# Patient Record
Sex: Male | Born: 1965 | Race: White | Hispanic: No | Marital: Married | Smoking: Never smoker
Health system: Southern US, Community
[De-identification: ages and names within clinical notes are randomized; demographics above are authoritative.]

## PROBLEM LIST (undated history)

## (undated) DIAGNOSIS — K219 Gastro-esophageal reflux disease without esophagitis: Secondary | ICD-10-CM

## (undated) DIAGNOSIS — C801 Malignant (primary) neoplasm, unspecified: Secondary | ICD-10-CM

## (undated) DIAGNOSIS — I10 Essential (primary) hypertension: Secondary | ICD-10-CM

## (undated) DIAGNOSIS — K5792 Diverticulitis of intestine, part unspecified, without perforation or abscess without bleeding: Secondary | ICD-10-CM

## (undated) DIAGNOSIS — G4730 Sleep apnea, unspecified: Secondary | ICD-10-CM

## (undated) HISTORY — DX: Essential (primary) hypertension: I10

## (undated) HISTORY — DX: Gastro-esophageal reflux disease without esophagitis: K21.9

## (undated) HISTORY — DX: Diverticulitis of intestine, part unspecified, without perforation or abscess without bleeding: K57.92

## (undated) HISTORY — DX: Malignant (primary) neoplasm, unspecified: C80.1

## (undated) HISTORY — DX: Sleep apnea, unspecified: G47.30

---

## 1998-03-31 DIAGNOSIS — G4730 Sleep apnea, unspecified: Secondary | ICD-10-CM

## 1998-03-31 HISTORY — DX: Sleep apnea, unspecified: G47.30

## 2004-05-22 ENCOUNTER — Ambulatory Visit: Payer: Self-pay

## 2007-03-01 ENCOUNTER — Emergency Department: Payer: Self-pay | Admitting: Emergency Medicine

## 2007-03-01 ENCOUNTER — Other Ambulatory Visit: Payer: Self-pay

## 2007-03-02 ENCOUNTER — Other Ambulatory Visit: Payer: Self-pay

## 2009-08-24 ENCOUNTER — Ambulatory Visit: Payer: Self-pay | Admitting: Anesthesiology

## 2010-06-04 ENCOUNTER — Ambulatory Visit: Payer: Self-pay | Admitting: Anesthesiology

## 2010-06-21 ENCOUNTER — Ambulatory Visit: Payer: Self-pay | Admitting: Anesthesiology

## 2010-07-22 ENCOUNTER — Ambulatory Visit: Payer: Self-pay | Admitting: Anesthesiology

## 2010-09-03 ENCOUNTER — Ambulatory Visit: Payer: Self-pay | Admitting: Anesthesiology

## 2010-11-22 ENCOUNTER — Ambulatory Visit: Payer: Self-pay | Admitting: Anesthesiology

## 2011-10-01 ENCOUNTER — Ambulatory Visit: Payer: Self-pay | Admitting: Unknown Physician Specialty

## 2012-02-11 ENCOUNTER — Ambulatory Visit: Payer: Self-pay | Admitting: Family Medicine

## 2012-03-04 ENCOUNTER — Ambulatory Visit: Payer: Self-pay | Admitting: Urology

## 2012-03-31 DIAGNOSIS — K5792 Diverticulitis of intestine, part unspecified, without perforation or abscess without bleeding: Secondary | ICD-10-CM

## 2012-03-31 HISTORY — DX: Diverticulitis of intestine, part unspecified, without perforation or abscess without bleeding: K57.92

## 2012-05-01 DIAGNOSIS — C801 Malignant (primary) neoplasm, unspecified: Secondary | ICD-10-CM

## 2012-05-01 HISTORY — PX: KIDNEY SURGERY: SHX687

## 2012-05-01 HISTORY — DX: Malignant (primary) neoplasm, unspecified: C80.1

## 2014-09-06 ENCOUNTER — Encounter: Payer: Self-pay | Admitting: Family Medicine

## 2014-09-06 ENCOUNTER — Ambulatory Visit (INDEPENDENT_AMBULATORY_CARE_PROVIDER_SITE_OTHER): Payer: Federal, State, Local not specified - PPO | Admitting: Family Medicine

## 2014-09-06 VITALS — BP 132/92 | HR 68 | Temp 98.1°F | Resp 16 | Ht 71.0 in | Wt 230.4 lb

## 2014-09-06 DIAGNOSIS — I10 Essential (primary) hypertension: Secondary | ICD-10-CM | POA: Diagnosis not present

## 2014-09-06 DIAGNOSIS — K5730 Diverticulosis of large intestine without perforation or abscess without bleeding: Secondary | ICD-10-CM | POA: Diagnosis not present

## 2014-09-06 MED ORDER — CHLORTHALIDONE 25 MG PO TABS: 25.0000 mg | ORAL_TABLET | Freq: Every day | ORAL | Status: DC

## 2014-09-06 NOTE — Progress Notes (Signed)
Subjective:     Patient ID: Samuel Oliver, male   DOB: 10-30-1965, 49 y.o.   MRN: [id number]  HPI  Chief Complaint  Patient presents with  . Hypertension    Patient comes in office today with concerns of elevated blood pressure. Patient states at his doctors appt Monday reading was 180/120 and last night was 145/98.  Reports compliance with HCTZ. States he is ready to schedule screening colonoscopy.   Review of Systems  Respiratory: Negative for shortness of breath.   Cardiovascular: Negative for chest pain and palpitations.       Objective:   Physical Exam  Constitutional: He appears well-developed and well-nourished. No distress.  Cardiovascular: Normal rate and regular rhythm.   Pulmonary/Chest: Breath sounds normal.  Musculoskeletal: He exhibits no edema (in his lower extremities).       Assessment:     1. Essential hypertension  - chlorthalidone (HYGROTON) 25 MG tablet; Take 1 tablet (25 mg total) by mouth daily.  Dispense: 30 tablet; Refill: 0  2. Diverticulosis of large intestine without hemorrhage  - Ambulatory referral to General Surgery. Wishes to have Dr. Bary Castilla perform his procedure.    Plan:     Will stop HCTZ

## 2014-09-06 NOTE — Patient Instructions (Signed)
Stop HCTZ and start new medication tomorrow.

## 2014-09-08 ENCOUNTER — Encounter: Payer: Self-pay | Admitting: General Surgery

## 2014-09-14 ENCOUNTER — Telehealth: Payer: Self-pay | Admitting: Family Medicine

## 2014-09-14 NOTE — Telephone Encounter (Signed)
Give it two weeks (6 more days) then come in for a nurse bp check.

## 2014-09-14 NOTE — Telephone Encounter (Signed)
Pt states that he was given prescription for a new blood pressure medication but his blood pressure is still erratic.Please advise

## 2014-09-15 NOTE — Telephone Encounter (Signed)
Pt was advised of below and triaged to [facility]. Thanks TNP

## 2014-09-15 NOTE — Telephone Encounter (Signed)
Advised patient that he should wait a few more days and give the BP med time to get in his system. Patient denies any SOB, swelling in the extremities, dizziness or chest pain. He reports that he does still have occasional headaches but thinks it could be allergy related. Patient reports that his dystolic number is in the normal range (<68mmHg) in the mornings, but as the day goes on his dyastolic number ranges in the mid to upper 90s. Instructed patient to continue to monitor BP and come in for BP check next week. Patient verbalizes understanding and agrees to treatment plan.

## 2014-09-18 NOTE — Telephone Encounter (Signed)
Pt states his blood pressure is still running high and he states his heart rate is high.  Pt states he can tell his BP is high.  Pt is asking does he need to change the medication?  CB#[phone]/MJ

## 2014-09-19 ENCOUNTER — Other Ambulatory Visit: Payer: Self-pay | Admitting: Family Medicine

## 2014-09-19 ENCOUNTER — Ambulatory Visit: Payer: Federal, State, Local not specified - PPO | Admitting: Family Medicine

## 2014-09-19 ENCOUNTER — Encounter: Payer: Self-pay | Admitting: Family Medicine

## 2014-09-19 VITALS — BP 118/88 | HR 96 | Resp 16

## 2014-09-19 DIAGNOSIS — I10 Essential (primary) hypertension: Secondary | ICD-10-CM

## 2014-09-19 MED ORDER — CHLORTHALIDONE 25 MG PO TABS: 25.0000 mg | ORAL_TABLET | Freq: Every day | ORAL | Status: DC

## 2014-09-19 NOTE — Telephone Encounter (Signed)
Appt arranged for this afternoon.

## 2014-09-19 NOTE — Telephone Encounter (Signed)
CMA bp check today. I will add second medication if elevated at that time.

## 2014-09-19 NOTE — Telephone Encounter (Signed)
Would you suggest office visit at this point to discuss options for treatment?

## 2014-09-19 NOTE — Progress Notes (Signed)
Patient ID: Samuel Oliver, male   DOB: 11/04/1965, 49 y.o.   MRN: [id number] Remain on chlorthalidone at present dose. Return in 2-4 weeks for nurse bp check.

## 2014-09-20 ENCOUNTER — Ambulatory Visit (INDEPENDENT_AMBULATORY_CARE_PROVIDER_SITE_OTHER): Payer: Federal, State, Local not specified - PPO | Admitting: General Surgery

## 2014-09-20 ENCOUNTER — Encounter: Payer: Self-pay | Admitting: General Surgery

## 2014-09-20 VITALS — BP 128/70 | HR 82 | Resp 14 | Ht 71.0 in | Wt 228.0 lb

## 2014-09-20 DIAGNOSIS — Z1211 Encounter for screening for malignant neoplasm of colon: Secondary | ICD-10-CM | POA: Diagnosis not present

## 2014-09-20 MED ORDER — POLYETHYLENE GLYCOL 3350 17 GM/SCOOP PO POWD: ORAL | Status: DC

## 2014-09-20 NOTE — Patient Instructions (Addendum)
Colonoscopy A colonoscopy is an exam to look at the entire large intestine (colon). This exam can help find problems such as tumors, polyps, inflammation, and areas of bleeding. The exam takes about 1 hour.  LET [facility] CARE PROVIDER KNOW ABOUT:   Any allergies you have.  All medicines you are taking, including vitamins, herbs, eye drops, creams, and over-the-counter medicines.  Previous problems you or members of your family have had with the use of anesthetics.  Any blood disorders you have.  Previous surgeries you have had.  Medical conditions you have. RISKS AND COMPLICATIONS  Generally, this is a safe procedure. However, as with any procedure, complications can occur. Possible complications include:  Bleeding.  Tearing or rupture of the colon wall.  Reaction to medicines given during the exam.  Infection (rare). BEFORE THE PROCEDURE   Ask your health care provider about changing or stopping your regular medicines.  You may be prescribed an oral bowel prep. This involves drinking a large amount of medicated liquid, starting the day before your procedure. The liquid will cause you to have multiple loose stools until your stool is almost clear or light green. This cleans out your colon in preparation for the procedure.  Do not eat or drink anything else once you have started the bowel prep, unless your health care provider tells you it is safe to do so.  Arrange for someone to drive you home after the procedure. PROCEDURE   You will be given medicine to help you relax (sedative).  You will lie on your side with your knees bent.  A long, flexible tube with a light and camera on the end (colonoscope) will be inserted through the rectum and into the colon. The camera sends video back to a computer screen as it moves through the colon. The colonoscope also releases carbon dioxide gas to inflate the colon. This helps your health care provider see the area better.  During  the exam, your health care provider may take a small tissue sample (biopsy) to be examined under a microscope if any abnormalities are found.  The exam is finished when the entire colon has been viewed. AFTER THE PROCEDURE   Do not drive for 24 hours after the exam.  You may have a small amount of blood in your stool.  You may pass moderate amounts of gas and have mild abdominal cramping or bloating. This is caused by the gas used to inflate your colon during the exam.  Ask when your test results will be ready and how you will get your results. Make sure you get your test results. Document Released: 03/14/2000 Document Revised: 01/05/2013 Document Reviewed: 11/22/2012 [facility] Patient Information 2015 [place], [place]. This information is not intended to replace advice given to you by your health care provider. Make sure you discuss any questions you have with your health care provider.  Patient has been scheduled for a colonoscopy on 11-08-14 at [facility].

## 2014-09-20 NOTE — Progress Notes (Signed)
Patient ID: Samuel Oliver, male   DOB: 20-Dec-1965, 49 y.o.   MRN: [id number]  Chief Complaint  Patient presents with  . Colonoscopy    HPI Samuel Oliver is a 49 y.o. male here for assessment for a colonoscopy. He has had none prior. Denies any gastrointestinal issues. Bowels are regular and daily, no bleeding He has had kidney cancer in the past, detected while doing a CT scan for diverticulitis. No family history of colon cancer.   HPI  Past Medical History  Diagnosis Date  . GERD (gastroesophageal reflux disease)   . Hypertension   . Diverticulitis 2014  . Cancer Feb 2014    left kidney  . Sleep apnea 2000    CPAP    Past Surgical History  Procedure Laterality Date  . Kidney surgery Left 05/2012    [facility] partial removal     Family History  Problem Relation Age of Onset  . Heart disease Father     Social History History  Substance Use Topics  . Smoking status: Never Smoker   . Smokeless tobacco: Never Used  . Alcohol Use: 0.0 oz/week    0 Standard drinks or equivalent per week    Allergies  Allergen Reactions  . Beef Extract Anaphylaxis  . 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 Pork (Porcine) Protein Anaphylaxis    Current Outpatient Prescriptions  Medication Sig Dispense Refill  . chlorthalidone (HYGROTON) 25 MG tablet Take 1 tablet (25 mg total) by mouth daily. 30 tablet 0  . omeprazole (PRILOSEC) 20 MG capsule 1 TABLET DR, ORAL, DAILY  3  . polyethylene glycol powder (GLYCOLAX/MIRALAX) powder 255 grams one bottle for colonoscopy prep 255 g 0   No current facility-administered medications for this visit.    Review of Systems Review of Systems  Constitutional: Negative.   Respiratory: Negative.   Cardiovascular: Negative.   Gastrointestinal: Negative for nausea, diarrhea, constipation, blood in stool, anal bleeding and rectal pain.    Blood pressure 128/70, pulse 82, resp. rate 14, height 5\' 11"  (1.803 m), weight 228 lb (103.42  kg).  Physical Exam Physical Exam  Constitutional: He is oriented to person, place, and time. He appears well-developed and well-nourished.  HENT:  Mouth/Throat: Oropharynx is clear and moist.  Eyes: Conjunctivae are normal. No scleral icterus.  Neck: Neck supple.  Cardiovascular: Normal rate, regular rhythm and normal heart sounds.   Pulmonary/Chest: Effort normal and breath sounds normal.  Lymphadenopathy:    He has no cervical adenopathy.  Neurological: He is alert and oriented to person, place, and time.  Skin: Skin is warm and dry.  Psychiatric: He has a normal mood and affect.    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 Negative margins. This was resected in early 2014. Creatinine in June 2015 was 1.02. Unchanged from prior exams.  Assessment    Candidate for screening colonoscopy.    Plan    Colonoscopy with possible biopsy/polypectomy prn: Information regarding the procedure, including its potential risks and complications (including but not limited to perforation of the bowel, which may require emergency surgery to repair, and bleeding) was verbally given to the patient. Educational information regarding lower instestinal endoscopy was given to the patient. Written instructions for how to complete the bowel prep using Miralax were provided. The importance of drinking ample fluids to avoid dehydration as a result of the prep emphasized.    Patient has been scheduled  for a colonoscopy on 11-08-14 at [facility].   PCP/Ref:  Theresia Lo 09/21/2014, 7:46 PM

## 2014-09-21 DIAGNOSIS — Z1211 Encounter for screening for malignant neoplasm of colon: Secondary | ICD-10-CM | POA: Insufficient documentation

## 2014-09-21 NOTE — H&P (Signed)
HPI  Samuel Oliver is a 49 y.o. male here for assessment for a colonoscopy. He has had none prior. Denies any gastrointestinal issues. Bowels are regular and daily, no bleeding  He has had kidney cancer in the past, detected while doing a CT scan for diverticulitis. No family history of colon cancer.  HPI  Past Medical History   Diagnosis  Date   .  GERD (gastroesophageal reflux disease)    .  Hypertension    .  Diverticulitis  2014   .  Cancer  Feb 2014     left kidney   .  Sleep apnea  2000     CPAP    Past Surgical History   Procedure  Laterality  Date   .  Kidney surgery  Left  05/2012     [facility] partial removal    Family History   Problem  Relation  Age of Onset   .  Heart disease  Father     Social History  History   Substance Use Topics   .  Smoking status:  Never Smoker   .  Smokeless tobacco:  Never Used   .  Alcohol Use:  0.0 oz/week     0 Standard drinks or equivalent per week    Allergies   Allergen  Reactions   .  Beef Extract  Anaphylaxis   .  Milk-Related Compounds  Anaphylaxis     from mammal   .  Pork (Porcine) Protein  Anaphylaxis    Current Outpatient Prescriptions   Medication  Sig  Dispense  Refill   .  chlorthalidone (HYGROTON) 25 MG tablet  Take 1 tablet (25 mg total) by mouth daily.  30 tablet  0   .  omeprazole (PRILOSEC) 20 MG capsule  1 TABLET DR, ORAL, DAILY   3   .  polyethylene glycol powder (GLYCOLAX/MIRALAX) powder  255 grams one bottle for colonoscopy prep  255 g  0    No current facility-administered medications for this visit.    Review of Systems  Review of Systems  Constitutional: Negative.  Respiratory: Negative.  Cardiovascular: Negative.  Gastrointestinal: Negative for nausea, diarrhea, constipation, blood in stool, anal bleeding and rectal pain.   Blood pressure 128/70, pulse 82, resp. rate 14, height 5\' 11"  (1.803 m), weight 228 lb (103.42 kg).  Physical Exam  Physical Exam  Constitutional: He is oriented to person, place,  and time. He appears well-developed and well-nourished.  HENT:  Mouth/Throat: Oropharynx is clear and moist.  Eyes: Conjunctivae are normal. No scleral icterus.  Neck: Neck supple.  Cardiovascular: Normal rate, regular rhythm and normal heart sounds.  Pulmonary/Chest: Effort normal and breath sounds normal.  Lymphadenopathy:  He has no cervical adenopathy.  Neurological: He is alert and oriented to person, place, and time.  Skin: Skin is warm and dry.  Psychiatric: He has a normal mood and affect.   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 Negative margins. This was resected in early 2014. Creatinine in June 2015 was 1.02. Unchanged from prior exams.  Assessment   Candidate for screening colonoscopy.   Plan   Colonoscopy with possible biopsy/polypectomy prn: Information regarding the procedure, including its potential risks and complications (including but not limited to perforation of the bowel, which may require emergency surgery to repair, and bleeding) was verbally given to the patient. Educational information regarding lower instestinal endoscopy was given to the patient.  Written instructions for how to complete the bowel prep using Miralax were provided. The importance of drinking ample fluids to avoid dehydration as a result of the prep emphasized.   Patient has been scheduled for a colonoscopy on 11-08-14 at [facility].  PCP/Ref: Theresia Lo

## 2014-10-09 ENCOUNTER — Ambulatory Visit
Admission: RE | Admit: 2014-10-09 | Discharge: 2014-10-09 | Disposition: A | Discharge status: Home / Self Care | Payer: Federal, State, Local not specified - PPO | Source: Ambulatory Visit | Attending: Family Medicine | Admitting: Family Medicine

## 2014-10-09 ENCOUNTER — Other Ambulatory Visit: Payer: Self-pay | Admitting: Family Medicine

## 2014-10-09 ENCOUNTER — Ambulatory Visit (INDEPENDENT_AMBULATORY_CARE_PROVIDER_SITE_OTHER): Payer: Federal, State, Local not specified - PPO | Admitting: Family Medicine

## 2014-10-09 ENCOUNTER — Encounter: Payer: Self-pay | Admitting: Family Medicine

## 2014-10-09 VITALS — BP 108/86 | HR 78 | Temp 98.1°F | Resp 16 | Wt 228.0 lb

## 2014-10-09 DIAGNOSIS — Z13220 Encounter for screening for lipoid disorders: Secondary | ICD-10-CM | POA: Diagnosis not present

## 2014-10-09 DIAGNOSIS — M25512 Pain in left shoulder: Secondary | ICD-10-CM | POA: Diagnosis present

## 2014-10-09 DIAGNOSIS — M19012 Primary osteoarthritis, left shoulder: Secondary | ICD-10-CM | POA: Insufficient documentation

## 2014-10-09 DIAGNOSIS — I10 Essential (primary) hypertension: Secondary | ICD-10-CM | POA: Diagnosis not present

## 2014-10-09 MED ORDER — CHLORTHALIDONE 25 MG PO TABS: 25.0000 mg | ORAL_TABLET | Freq: Every day | ORAL | Status: DC

## 2014-10-09 NOTE — Progress Notes (Signed)
Subjective:     Patient ID: Samuel Oliver, male   DOB: 1966/01/04, 49 y.o.   MRN: [id number]  HPI  Chief Complaint  Patient presents with  . Blood Pressure Check    patient is present in office for follow up on 09/06/14. Patient was started on Chlorthalidone 25mg  qd and discontinued HCTZ 25mg , blood pressure in office that day was132/92  . Shoulder Injury    patient states that he injured his left shoulder a year ago and is still having pain in his left shoulder/arm. Patient states that when he bends or flexes his arm a certain way he still has pain.   States he noticed the pain in his anterior shoulder when he rotates his arm out.   Review of Systems  Musculoskeletal:       States he resumed weight lifting in January and his shoulder recommenced hurting.       Objective:   Physical Exam  Constitutional: He appears well-developed and well-nourished.  Cardiovascular: Normal rate and regular rhythm.   Pulmonary/Chest: Breath sounds normal.  Musculoskeletal:  Grips and left shoulder strength 5/5. Mild discomfort flexing > 90 degrees. Increased pain left anterior  when externally rotating his left shoulder > 90 degrees       Assessment:    1. Essential hypertension - chlorthalidone (HYGROTON) 25 MG tablet; Take 1 tablet (25 mg total) by mouth daily.  Dispense: 90 tablet; Refill: 3 - Comprehensive metabolic panel; Future  2. Left anterior shoulder pain - DG Shoulder Left; Future  3. Screening cholesterol level - Lipid panel; Future    Plan:    Further f/u pending x-ray and lab work. Consider orthopedic referral.

## 2014-10-09 NOTE — Patient Instructions (Signed)
I will call you with your x-ray report in the next 24 hours and your labs in the future.

## 2014-11-01 ENCOUNTER — Telehealth: Payer: Self-pay | Admitting: *Deleted

## 2014-11-01 NOTE — Telephone Encounter (Signed)
Patient contacted today and states his blood pressure pill has been changed to amlodipine besylate 5 mg once daily. He has also been placed on Meloxicam 15 mg once daily. Patient's medication list has been updated accordingly.  He reports that he has Miralax prescription.  We will proceed with colonoscopy that is scheduled at [facility] for 11-08-14.   Patient instructed to call the office should he have further questions.

## 2014-11-08 ENCOUNTER — Ambulatory Visit
Admission: RE | Admit: 2014-11-08 | Discharge: 2014-11-08 | Disposition: A | Discharge status: Home / Self Care | Payer: Federal, State, Local not specified - PPO | Source: Ambulatory Visit | Attending: General Surgery | Admitting: General Surgery

## 2014-11-08 ENCOUNTER — Encounter
Admission: RE | Disposition: A | Discharge status: Home / Self Care | Payer: Self-pay | Source: Ambulatory Visit | Attending: General Surgery

## 2014-11-08 ENCOUNTER — Ambulatory Visit: Payer: Federal, State, Local not specified - PPO | Admitting: Anesthesiology

## 2014-11-08 DIAGNOSIS — Z791 Long term (current) use of non-steroidal anti-inflammatories (NSAID): Secondary | ICD-10-CM | POA: Insufficient documentation

## 2014-11-08 DIAGNOSIS — Z79899 Other long term (current) drug therapy: Secondary | ICD-10-CM | POA: Insufficient documentation

## 2014-11-08 DIAGNOSIS — Z85528 Personal history of other malignant neoplasm of kidney: Secondary | ICD-10-CM | POA: Insufficient documentation

## 2014-11-08 DIAGNOSIS — K219 Gastro-esophageal reflux disease without esophagitis: Secondary | ICD-10-CM | POA: Insufficient documentation

## 2014-11-08 DIAGNOSIS — Z8249 Family history of ischemic heart disease and other diseases of the circulatory system: Secondary | ICD-10-CM | POA: Insufficient documentation

## 2014-11-08 DIAGNOSIS — Z905 Acquired absence of kidney: Secondary | ICD-10-CM | POA: Insufficient documentation

## 2014-11-08 DIAGNOSIS — K5730 Diverticulosis of large intestine without perforation or abscess without bleeding: Secondary | ICD-10-CM | POA: Diagnosis not present

## 2014-11-08 DIAGNOSIS — G4730 Sleep apnea, unspecified: Secondary | ICD-10-CM | POA: Diagnosis not present

## 2014-11-08 DIAGNOSIS — Z91011 Allergy to milk products: Secondary | ICD-10-CM | POA: Insufficient documentation

## 2014-11-08 DIAGNOSIS — Z1211 Encounter for screening for malignant neoplasm of colon: Secondary | ICD-10-CM

## 2014-11-08 DIAGNOSIS — Z91018 Allergy to other foods: Secondary | ICD-10-CM | POA: Diagnosis not present

## 2014-11-08 DIAGNOSIS — I10 Essential (primary) hypertension: Secondary | ICD-10-CM | POA: Insufficient documentation

## 2014-11-08 HISTORY — PX: COLONOSCOPY: SHX5424

## 2014-11-08 SURGERY — COLONOSCOPY
Anesthesia: General

## 2014-11-08 MED ORDER — FENTANYL CITRATE (PF) 100 MCG/2ML IJ SOLN
INTRAMUSCULAR | Status: DC | PRN
  Administered 2014-11-08: 50 ug via INTRAVENOUS

## 2014-11-08 MED ORDER — SODIUM CHLORIDE 0.9 % IV SOLN
INTRAVENOUS | Status: DC
  Administered 2014-11-08 (×2): via INTRAVENOUS

## 2014-11-08 MED ORDER — PROPOFOL 10 MG/ML IV BOLUS
INTRAVENOUS | Status: DC | PRN
  Administered 2014-11-08: 50 mg via INTRAVENOUS

## 2014-11-08 MED ORDER — LIDOCAINE HCL (PF) 2 % IJ SOLN
INTRAMUSCULAR | Status: DC | PRN
  Administered 2014-11-08: 50 mg

## 2014-11-08 MED ORDER — MIDAZOLAM HCL 5 MG/5ML IJ SOLN
INTRAMUSCULAR | Status: DC | PRN
  Administered 2014-11-08: 1 mg via INTRAVENOUS

## 2014-11-08 MED ORDER — PROPOFOL INFUSION 10 MG/ML OPTIME
INTRAVENOUS | Status: DC | PRN
  Administered 2014-11-08: 120 ug/kg/min via INTRAVENOUS

## 2014-11-08 NOTE — Anesthesia Preprocedure Evaluation (Signed)
Anesthesia Evaluation  Patient identified by MRN, date of birth, ID band Patient awake    Reviewed: Allergy & Precautions, NPO status , Patient's Chart, lab work & pertinent test results  History of Anesthesia Complications (+) PONV  Airway Mallampati: III  TM Distance: >3 FB Neck ROM: Full    Dental  (+) Chipped   Pulmonary sleep apnea and Continuous Positive Airway Pressure Ventilation ,          Cardiovascular hypertension, Pt. on medications     Neuro/Psych    GI/Hepatic GERD-  Medicated and Controlled,  Endo/Other    Renal/GU Renal disease (Partial nephrectomy)     Musculoskeletal   Abdominal   Peds  Hematology   Anesthesia Other Findings   Reproductive/Obstetrics                             Anesthesia Physical Anesthesia Plan  ASA: II  Anesthesia Plan: General   Post-op Pain Management:    Induction: Intravenous  Airway Management Planned: Nasal Cannula  Additional Equipment:   Intra-op Plan:   Post-operative Plan:   Informed Consent: I have reviewed the patients History and Physical, chart, labs and discussed the procedure including the risks, benefits and alternatives for the proposed anesthesia with the patient or authorized representative who has indicated his/her understanding and acceptance.     Plan Discussed with:   Anesthesia Plan Comments:         Anesthesia Quick Evaluation

## 2014-11-08 NOTE — Op Note (Signed)
[facility] Patient Name: Samuel Oliver Procedure Date: 11/08/2014 10:48 AM MRN: [id number] Account #: [id number] Date of Birth: 1965/10/29 Admit Type: Outpatient Age: 49 Room: [facility] ENDO ROOM 1 Gender: Male Note Status: Finalized Procedure:         Colonoscopy Indications:       Screening for colorectal malignant neoplasm Providers:         Robert Bellow, MD Referring MD:      Janine Ores. Rosanna Randy, MD (Referring MD) Medicines:         Monitored Anesthesia Care Complications:     No immediate complications. Procedure:         Pre-Anesthesia Assessment:                    - Prior to the procedure, a History and Physical was                     performed, and patient medications, allergies and                     sensitivities were reviewed. The patient's tolerance of                     previous anesthesia was reviewed.                    - The risks and benefits of the procedure and the sedation                     options and risks were discussed with the patient. All                     questions were answered and informed consent was obtained.                    After obtaining informed consent, the colonoscope was                     passed under direct vision. Throughout the procedure, the                     patient's blood pressure, pulse, and oxygen saturations                     were monitored continuously. The Colonoscope was                     introduced through the anus and advanced to the the cecum,                     identified by the appendiceal orifice, ileocecal valve and                     palpation. The colonoscopy was performed without                     difficulty. The patient tolerated the procedure well. The                     quality of the bowel preparation was excellent. Findings:      A few medium-mouthed diverticula were found in the sigmoid colon.      The retroflexed view of the distal rectum and anal verge was  normal and       showed no anal  or rectal abnormalities. Impression:        - Diverticulosis in the sigmoid colon.                    - The distal rectum and anal verge are normal on                     retroflexion view.                    - No specimens collected. Recommendation:    - Repeat colonoscopy in 10 years for screening purposes. Diagnosis Code(s): --- Professional ---                    K57.30, Diverticulosis of large intestine without                     perforation or abscess without bleeding                    Z12.11, Encounter for screening for malignant neoplasm of                     colon Robert Bellow, MD 11/08/2014 11:10:47 AM This report has been signed electronically. Number of Addenda: 0 Note Initiated On: 11/08/2014 10:48 AM Scope Withdrawal Time: 0 hours 8 minutes 57 seconds  Total Procedure Duration: 0 hours 15 minutes 44 seconds       [facility]

## 2014-11-08 NOTE — H&P (Signed)
Samuel Oliver is an 49 y.o. male.   Chief Complaint: Candidate for screening colonoscopy.   HPI: A 49 year old male for a screening colonoscopy. No family history of colon cancer. No GI issues. No change in clinical history since last visit.  Past Medical History  Diagnosis Date  . GERD (gastroesophageal reflux disease)   . Hypertension   . Diverticulitis 2014  . Cancer Feb 2014    left kidney  . Sleep apnea 2000    CPAP    Past Surgical History  Procedure Laterality Date  . Kidney surgery Left 05/2012    [facility] partial removal     Family History  Problem Relation Age of Onset  . Heart disease Father    Social History:  reports that he has never smoked. He has never used smokeless tobacco. He reports that he drinks alcohol. He reports that he does not use illicit drugs.  Allergies:  Allergies  Allergen Reactions  . Beef Extract Anaphylaxis  . 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 Pork (Porcine) Protein Anaphylaxis    Medications Prior to Admission  Medication Sig Dispense Refill  . AMLODIPINE BESYLATE PO Take 5 mg by mouth daily.    . meloxicam (MOBIC) 15 MG tablet Take 15 mg by mouth daily.    Marland Kitchen omeprazole (PRILOSEC) 20 MG capsule 1 TABLET DR, ORAL, DAILY  3  . polyethylene glycol powder (GLYCOLAX/MIRALAX) powder 255 grams one bottle for colonoscopy prep 255 g 0    No results found for this or any previous visit (from the past 70 hour(s)). No results found.  Review of Systems  All other systems reviewed and are negative.   Blood pressure 132/87, pulse 64, temperature 97 F (36.1 C), temperature source Tympanic, resp. rate 17, height 5\' 11"  (1.803 m), weight 220 lb (99.791 kg), SpO2 100 %. Physical Exam  Constitutional: He appears well-developed and well-nourished.  HENT:  Head: Normocephalic.  Eyes: Conjunctivae are normal.  Neck: Neck supple. No thyromegaly present.  Cardiovascular: Normal rate and regular rhythm.   Respiratory: Effort normal  and breath sounds normal.  GI: Soft.     Assessment/Plan Candidate for screening colonoscopy.  Robert Bellow 11/08/2014, 10:46 AM

## 2014-11-08 NOTE — Anesthesia Postprocedure Evaluation (Signed)
  Anesthesia Post-op Note  Patient: Samuel Oliver  Procedure(s) Performed: Procedure(s): COLONOSCOPY (N/A)  Anesthesia type:General  Patient location: PACU  Post pain: Pain level controlled  Post assessment: Post-op Vital signs reviewed, Patient's Cardiovascular Status Stable, Respiratory Function Stable, Patent Airway and No signs of Nausea or vomiting  Post vital signs: Reviewed and stable  Last Vitals:  Filed Vitals:   11/08/14 1143  BP: 118/82  Pulse: 70  Temp:   Resp: 20    Level of consciousness: awake, alert  and patient cooperative  Complications: No apparent anesthesia complications

## 2014-11-08 NOTE — Transfer of Care (Signed)
Immediate Anesthesia Transfer of Care Note  Patient: Samuel Oliver  Procedure(s) Performed: Procedure(s): COLONOSCOPY (N/A)  Patient Location: PACU  Anesthesia Type:General  Level of Consciousness: sedated  Airway & Oxygen Therapy: Patient Spontanous Breathing and Patient connected to nasal cannula oxygen  Post-op Assessment: Report given to RN and Post -op Vital signs reviewed and stable  Post vital signs: Reviewed and stable  Last Vitals:  Filed Vitals:   11/08/14 1013  BP: 132/87  Pulse: 64  Temp: 36.1 C  Resp: 17    Complications: No apparent anesthesia complications

## 2014-11-10 ENCOUNTER — Encounter: Payer: Self-pay | Admitting: General Surgery

## 2015-04-26 ENCOUNTER — Other Ambulatory Visit: Payer: Self-pay | Admitting: Family Medicine

## 2015-07-18 DIAGNOSIS — K080 Exfoliation of teeth due to systemic causes: Secondary | ICD-10-CM | POA: Diagnosis not present

## 2015-08-23 DIAGNOSIS — R42 Dizziness and giddiness: Secondary | ICD-10-CM | POA: Diagnosis not present

## 2015-08-23 DIAGNOSIS — T7840XA Allergy, unspecified, initial encounter: Secondary | ICD-10-CM | POA: Diagnosis not present

## 2015-09-07 ENCOUNTER — Other Ambulatory Visit: Payer: Self-pay | Admitting: *Deleted

## 2015-10-24 ENCOUNTER — Other Ambulatory Visit: Payer: Self-pay | Admitting: Family Medicine

## 2015-12-13 ENCOUNTER — Encounter: Payer: Self-pay | Admitting: Family Medicine

## 2015-12-13 ENCOUNTER — Ambulatory Visit (INDEPENDENT_AMBULATORY_CARE_PROVIDER_SITE_OTHER): Payer: Federal, State, Local not specified - PPO | Admitting: Family Medicine

## 2015-12-13 VITALS — BP 120/84 | HR 60 | Temp 97.8°F | Resp 16 | Wt 212.0 lb

## 2015-12-13 DIAGNOSIS — Z85528 Personal history of other malignant neoplasm of kidney: Secondary | ICD-10-CM | POA: Insufficient documentation

## 2015-12-13 DIAGNOSIS — I10 Essential (primary) hypertension: Secondary | ICD-10-CM

## 2015-12-13 DIAGNOSIS — L989 Disorder of the skin and subcutaneous tissue, unspecified: Secondary | ICD-10-CM | POA: Diagnosis not present

## 2015-12-13 DIAGNOSIS — G4733 Obstructive sleep apnea (adult) (pediatric): Secondary | ICD-10-CM

## 2015-12-13 DIAGNOSIS — Z13220 Encounter for screening for lipoid disorders: Secondary | ICD-10-CM

## 2015-12-13 NOTE — Progress Notes (Addendum)
Subjective:     Patient ID: Samuel Oliver, male   DOB: Nov 23, 1965, 50 y.o.   MRN: [id number]  HPI  Chief Complaint  Patient presents with  . Skin Problem    Patient would like to address skin changes to his right leg for the past year. Patient states that he believes he has a bump/wart on the right side of his leg that has grown in size. Patient decribes area as very itchy  Due for f/u of HTN. Also sees cardiology, Dr. Clayborn Bigness, due to hx of chest pain with next followup in January, 2018. Remains on C-Pap but has lost a lot of weight working out with a trainer 5 days a week since the beginning of the year.   Review of Systems  Cardiovascular:       Does describe chest discomfort during part of his exercise regimen which feels like a hose narrowing but not associated with shortness of breath, sweats, or palpitations.       Objective:   Physical Exam  Constitutional: He appears well-developed and well-nourished. No distress.  Cardiovascular: Normal rate and regular rhythm.   Pulmonary/Chest: Breath sounds normal.  Musculoskeletal: He exhibits no edema (of lower extremities).  Skin:  Right calf with 0.5 cm, flesh colored, verruca appearing lesion with well circumscribed borders.  Cryopen applied for 45 seconds.     Assessment:    1. Skin lesion of right lower limb - Cryotherapy/destruction benign or premalignant lesion  2. Essential hypertension - Comprehensive metabolic panel  3. Screening for cholesterol level - Lipid panel  4. OSA (obstructive sleep apnea : provided with Epworth screen-consider repeat sleep study as has significant weight loss.    Plan:    Further f/u pending lab work. Consider earlier referral to cardiology.

## 2015-12-13 NOTE — Patient Instructions (Signed)
We will call you with the lab results.and discuss further evaluation if necessary.

## 2015-12-24 DIAGNOSIS — I10 Essential (primary) hypertension: Secondary | ICD-10-CM | POA: Diagnosis not present

## 2015-12-24 DIAGNOSIS — Z13220 Encounter for screening for lipoid disorders: Secondary | ICD-10-CM | POA: Diagnosis not present

## 2015-12-25 LAB — COMPREHENSIVE METABOLIC PANEL
ALBUMIN: 4.6 g/dL (ref 3.5–5.5)
ALT: 17 IU/L (ref 0–44)
AST: 14 IU/L (ref 0–40)
Albumin/Globulin Ratio: 1.8 (ref 1.2–2.2)
Alkaline Phosphatase: 79 IU/L (ref 39–117)
BILIRUBIN TOTAL: 0.6 mg/dL (ref 0.0–1.2)
BUN/Creatinine Ratio: 15 (ref 9–20)
BUN: 15 mg/dL (ref 6–24)
CALCIUM: 9.4 mg/dL (ref 8.7–10.2)
CHLORIDE: 104 mmol/L (ref 96–106)
CO2: 27 mmol/L (ref 18–29)
Creatinine, Ser: 1 mg/dL (ref 0.76–1.27)
GFR calc Af Amer: 100 mL/min/{1.73_m2} (ref >59)
GFR calc non Af Amer: 86 mL/min/{1.73_m2} (ref >59)
GLOBULIN, TOTAL: 2.5 g/dL (ref 1.5–4.5)
Glucose: 114 mg/dL — ABNORMAL HIGH (ref 65–99)
Potassium: 4.2 mmol/L (ref 3.5–5.2)
SODIUM: 143 mmol/L (ref 134–144)
Total Protein: 7.1 g/dL (ref 6.0–8.5)

## 2015-12-25 LAB — LIPID PANEL
CHOLESTEROL TOTAL: 169 mg/dL (ref 100–199)
Chol/HDL Ratio: 5.8 ratio units — ABNORMAL HIGH (ref 0.0–5.0)
HDL: 29 mg/dL — ABNORMAL LOW (ref >39)
LDL Calculated: 120 mg/dL — ABNORMAL HIGH (ref 0–99)
Triglycerides: 101 mg/dL (ref 0–149)
VLDL Cholesterol Cal: 20 mg/dL (ref 5–40)

## 2015-12-31 DIAGNOSIS — I208 Other forms of angina pectoris: Secondary | ICD-10-CM | POA: Diagnosis not present

## 2015-12-31 DIAGNOSIS — R079 Chest pain, unspecified: Secondary | ICD-10-CM | POA: Diagnosis not present

## 2015-12-31 DIAGNOSIS — R011 Cardiac murmur, unspecified: Secondary | ICD-10-CM | POA: Diagnosis not present

## 2015-12-31 DIAGNOSIS — R0602 Shortness of breath: Secondary | ICD-10-CM | POA: Diagnosis not present

## 2016-01-21 DIAGNOSIS — R011 Cardiac murmur, unspecified: Secondary | ICD-10-CM | POA: Diagnosis not present

## 2016-01-21 DIAGNOSIS — I208 Other forms of angina pectoris: Secondary | ICD-10-CM | POA: Diagnosis not present

## 2016-01-21 DIAGNOSIS — R0602 Shortness of breath: Secondary | ICD-10-CM | POA: Diagnosis not present

## 2016-01-24 ENCOUNTER — Other Ambulatory Visit: Payer: Self-pay | Admitting: Family Medicine

## 2016-01-28 DIAGNOSIS — R011 Cardiac murmur, unspecified: Secondary | ICD-10-CM | POA: Diagnosis not present

## 2016-01-28 DIAGNOSIS — R0602 Shortness of breath: Secondary | ICD-10-CM | POA: Diagnosis not present

## 2016-01-28 DIAGNOSIS — K219 Gastro-esophageal reflux disease without esophagitis: Secondary | ICD-10-CM | POA: Diagnosis not present

## 2016-01-28 DIAGNOSIS — R079 Chest pain, unspecified: Secondary | ICD-10-CM | POA: Diagnosis not present

## 2016-04-26 ENCOUNTER — Other Ambulatory Visit: Payer: Self-pay | Admitting: Family Medicine

## 2016-07-10 ENCOUNTER — Encounter: Payer: Self-pay | Admitting: Family Medicine

## 2016-07-10 ENCOUNTER — Ambulatory Visit (INDEPENDENT_AMBULATORY_CARE_PROVIDER_SITE_OTHER): Payer: Federal, State, Local not specified - PPO | Admitting: Family Medicine

## 2016-07-10 DIAGNOSIS — J301 Allergic rhinitis due to pollen: Secondary | ICD-10-CM

## 2016-07-10 DIAGNOSIS — J302 Other seasonal allergic rhinitis: Secondary | ICD-10-CM | POA: Insufficient documentation

## 2016-07-10 NOTE — Progress Notes (Signed)
Subjective:     Patient ID: Samuel Oliver, male   DOB: Oct 11, 1965, 51 y.o.   MRN: [id number]  HPI  Chief Complaint  Patient presents with  . URI    Patient comes in office today with complaints of cold like symptoms for the past 3 days. Patient complains of productive cough with phlegm, shortness of breath, chest congestion and body aches. Patient has taken otc Ibuprofen for relief.   States he has developed PND with accompanying cough. No significant sinus congestion, fever, or chills. Reports seasonal allergies and tried Clartin D last night.   Review of Systems     Objective:   Physical Exam  Constitutional: He appears well-developed and well-nourished. No distress.  Ears: T.M's intact without inflammation Throat: tonsils absent without erythema Neck: no cervical adenopathy Lungs: clear     Assessment:    1. Seasonal allergic rhinitis due to pollen, unspecified chronicity     Plan:   Discussed otc allergy treatment. IF further cold sx develop he will add sudafed and Delsym as needed

## 2016-07-10 NOTE — Patient Instructions (Addendum)
Start plain Clartin or Allegra during the day and Benadryl at night. Consider adding Flonase spray if the above does not help. If you develop more cold sx add Delsym for cough and add Sudafed if significant sinus congestion.

## 2016-07-30 ENCOUNTER — Other Ambulatory Visit: Payer: Self-pay | Admitting: Family Medicine

## 2016-08-28 ENCOUNTER — Ambulatory Visit (INDEPENDENT_AMBULATORY_CARE_PROVIDER_SITE_OTHER): Payer: Federal, State, Local not specified - PPO | Admitting: Family Medicine

## 2016-08-28 ENCOUNTER — Encounter: Payer: Self-pay | Admitting: Family Medicine

## 2016-08-28 VITALS — BP 134/78 | HR 88 | Temp 98.3°F | Resp 16 | Wt 216.0 lb

## 2016-08-28 DIAGNOSIS — N5089 Other specified disorders of the male genital organs: Secondary | ICD-10-CM

## 2016-08-28 NOTE — Patient Instructions (Signed)
We will call you about the ultrasound. Try two Aleve twice daily with food.

## 2016-08-28 NOTE — Progress Notes (Signed)
Subjective:     Patient ID: Samuel Oliver, male   DOB: 1965/10/16, 51 y.o.   MRN: [id number]  HPI  Chief Complaint  Patient presents with  . Groin Pain    left sided. Has been going on for a while but has gotten worse in the last couple of months. He does work out and Psychologist, sport and exercise. No urinary symptoms.   States he has had a progressively enlarging testicle for the last year. Has been more tender lately especially with walking. No dysuria or change in bowel pattern.   Review of Systems     Objective:   Physical Exam  Constitutional: He appears well-developed and well-nourished. No distress.  Genitourinary:  Genitourinary Comments: Large left testicle with mild tenderness filling hemi-scrotum No hernia or bowel sounds heard over scrotum       Assessment:    1. Enlarged testicle: hx of renal carcinoma - US Scrotum; Future - [place] Art/Ven Flow Abd Pelv Doppler; Future    Plan:    Aleve for discomfort. Further f/u pending ultrasound results.

## 2016-09-03 ENCOUNTER — Ambulatory Visit
Admission: RE | Admit: 2016-09-03 | Discharge: 2016-09-03 | Disposition: A | Discharge status: Home / Self Care | Payer: Federal, State, Local not specified - PPO | Source: Ambulatory Visit | Attending: Family Medicine | Admitting: Family Medicine

## 2016-09-03 ENCOUNTER — Other Ambulatory Visit: Payer: Self-pay | Admitting: Family Medicine

## 2016-09-03 DIAGNOSIS — N5089 Other specified disorders of the male genital organs: Secondary | ICD-10-CM

## 2016-09-03 DIAGNOSIS — N448 Other noninflammatory disorders of the testis: Secondary | ICD-10-CM | POA: Diagnosis not present

## 2016-09-03 DIAGNOSIS — N433 Hydrocele, unspecified: Secondary | ICD-10-CM | POA: Insufficient documentation

## 2016-09-03 DIAGNOSIS — N503 Cyst of epididymis: Secondary | ICD-10-CM | POA: Insufficient documentation

## 2016-09-05 ENCOUNTER — Telehealth: Payer: Self-pay | Admitting: Family Medicine

## 2016-09-05 NOTE — Telephone Encounter (Signed)
Let patient know. If his pain gets considerably worse to report to the [facility] ER. I can offer him pain medication as well if he needs some now.

## 2016-09-05 NOTE — Telephone Encounter (Signed)
Pt advised to go to ER if pain worsens.He does not want pain medication at this time

## 2016-09-05 NOTE — Telephone Encounter (Signed)
Appointment for pt to see Dr Thurmond Butts  (urologist) is not until 09/15/16.Office states this is the best they can do unless you call to speak to resident on call at [phone]

## 2016-09-15 DIAGNOSIS — C642 Malignant neoplasm of left kidney, except renal pelvis: Secondary | ICD-10-CM | POA: Diagnosis not present

## 2016-09-15 DIAGNOSIS — Z08 Encounter for follow-up examination after completed treatment for malignant neoplasm: Secondary | ICD-10-CM | POA: Diagnosis not present

## 2016-09-15 DIAGNOSIS — Z85528 Personal history of other malignant neoplasm of kidney: Secondary | ICD-10-CM | POA: Diagnosis not present

## 2016-09-15 DIAGNOSIS — N433 Hydrocele, unspecified: Secondary | ICD-10-CM | POA: Diagnosis not present

## 2016-09-15 DIAGNOSIS — Z905 Acquired absence of kidney: Secondary | ICD-10-CM | POA: Diagnosis not present

## 2016-09-22 DIAGNOSIS — N433 Hydrocele, unspecified: Secondary | ICD-10-CM | POA: Diagnosis not present

## 2016-09-22 DIAGNOSIS — Z01818 Encounter for other preprocedural examination: Secondary | ICD-10-CM | POA: Diagnosis not present

## 2016-09-22 DIAGNOSIS — C642 Malignant neoplasm of left kidney, except renal pelvis: Secondary | ICD-10-CM | POA: Diagnosis not present

## 2016-10-03 DIAGNOSIS — K219 Gastro-esophageal reflux disease without esophagitis: Secondary | ICD-10-CM | POA: Diagnosis not present

## 2016-10-03 DIAGNOSIS — N433 Hydrocele, unspecified: Secondary | ICD-10-CM | POA: Diagnosis not present

## 2016-10-03 DIAGNOSIS — I10 Essential (primary) hypertension: Secondary | ICD-10-CM | POA: Diagnosis not present

## 2016-10-03 DIAGNOSIS — Z79899 Other long term (current) drug therapy: Secondary | ICD-10-CM | POA: Diagnosis not present

## 2016-10-03 DIAGNOSIS — Z905 Acquired absence of kidney: Secondary | ICD-10-CM | POA: Diagnosis not present

## 2016-10-03 DIAGNOSIS — Z85528 Personal history of other malignant neoplasm of kidney: Secondary | ICD-10-CM | POA: Diagnosis not present

## 2016-10-03 DIAGNOSIS — Z91018 Allergy to other foods: Secondary | ICD-10-CM | POA: Diagnosis not present

## 2016-10-03 DIAGNOSIS — N432 Other hydrocele: Secondary | ICD-10-CM | POA: Diagnosis not present

## 2016-10-30 ENCOUNTER — Encounter: Payer: Self-pay | Admitting: Family Medicine

## 2016-10-30 ENCOUNTER — Other Ambulatory Visit: Payer: Self-pay | Admitting: Family Medicine

## 2016-10-30 DIAGNOSIS — K219 Gastro-esophageal reflux disease without esophagitis: Secondary | ICD-10-CM | POA: Insufficient documentation

## 2016-11-13 DIAGNOSIS — C649 Malignant neoplasm of unspecified kidney, except renal pelvis: Secondary | ICD-10-CM | POA: Diagnosis not present

## 2016-11-13 DIAGNOSIS — Z09 Encounter for follow-up examination after completed treatment for conditions other than malignant neoplasm: Secondary | ICD-10-CM | POA: Diagnosis not present

## 2016-12-24 DIAGNOSIS — K080 Exfoliation of teeth due to systemic causes: Secondary | ICD-10-CM | POA: Diagnosis not present

## 2017-01-23 DIAGNOSIS — K080 Exfoliation of teeth due to systemic causes: Secondary | ICD-10-CM | POA: Diagnosis not present

## 2017-01-26 DIAGNOSIS — K080 Exfoliation of teeth due to systemic causes: Secondary | ICD-10-CM | POA: Diagnosis not present

## 2017-02-11 DIAGNOSIS — K080 Exfoliation of teeth due to systemic causes: Secondary | ICD-10-CM | POA: Diagnosis not present

## 2017-04-14 DIAGNOSIS — K080 Exfoliation of teeth due to systemic causes: Secondary | ICD-10-CM | POA: Diagnosis not present

## 2017-04-28 DIAGNOSIS — H9120 Sudden idiopathic hearing loss, unspecified ear: Secondary | ICD-10-CM | POA: Diagnosis not present

## 2017-04-28 DIAGNOSIS — H9041 Sensorineural hearing loss, unilateral, right ear, with unrestricted hearing on the contralateral side: Secondary | ICD-10-CM | POA: Diagnosis not present

## 2017-05-19 DIAGNOSIS — H9120 Sudden idiopathic hearing loss, unspecified ear: Secondary | ICD-10-CM | POA: Diagnosis not present

## 2017-05-19 DIAGNOSIS — H9041 Sensorineural hearing loss, unilateral, right ear, with unrestricted hearing on the contralateral side: Secondary | ICD-10-CM | POA: Diagnosis not present

## 2017-05-25 ENCOUNTER — Encounter: Payer: Self-pay | Admitting: Family Medicine

## 2017-05-25 ENCOUNTER — Ambulatory Visit: Payer: Federal, State, Local not specified - PPO | Admitting: Family Medicine

## 2017-05-25 VITALS — BP 124/90 | HR 82 | Temp 98.3°F | Resp 16 | Wt 214.0 lb

## 2017-05-25 DIAGNOSIS — M79671 Pain in right foot: Secondary | ICD-10-CM | POA: Diagnosis not present

## 2017-05-25 MED ORDER — PREDNISONE 20 MG PO TABS: ORAL_TABLET | ORAL | 0 refills | Status: DC

## 2017-05-25 NOTE — Patient Instructions (Addendum)
We will call you with the lab results. Try Pepcid 20 mg or Zantac 150 mg. Twice daily for reflux instead of omeprazole. We will call you with the lab results.  Gout Gout is painful swelling that can happen in some of your joints. Gout is a type of arthritis. This condition is caused by having too much uric acid in your body. Uric acid is a chemical that is made when your body breaks down substances called purines. If your body has too much uric acid, sharp crystals can form and build up in your joints. This causes pain and swelling. Gout attacks can happen quickly and be very painful (acute gout). Over time, the attacks can affect more joints and happen more often (chronic gout). Follow these instructions at home: During a Gout Attack  If directed, put ice on the painful area: ? Put ice in a plastic bag. ? Place a towel between your skin and the bag. ? Leave the ice on for 20 minutes, 2-3 times a day.  Rest the joint as much as possible. If the joint is in your leg, you may be given crutches to use.  Raise (elevate) the painful joint above the level of your heart as often as you can.  Drink enough fluids to keep your pee (urine) clear or pale yellow.  Take over-the-counter and prescription medicines only as told by your doctor.  Do not drive or use heavy machinery while taking prescription pain medicine.  Follow instructions from your doctor about what you can or cannot eat and drink.  Return to your normal activities as told by your doctor. Ask your doctor what activities are safe for you. Avoiding Future Gout Attacks  Follow a low-purine diet as told by a specialist (dietitian) or your doctor. Avoid foods and drinks that have a lot of purines, such as: ? Liver. ? Kidney. ? Anchovies. ? Asparagus. ? Herring. ? Mushrooms ? Mussels. ? Beer.  Limit alcohol intake to no more than 1 drink a day for nonpregnant women and 2 drinks a day for men. One drink equals 12 oz of beer, 5 oz of  wine, or 1 oz of hard liquor.  Stay at a healthy weight or lose weight if you are overweight. If you want to lose weight, talk with your doctor. It is important that you do not lose weight too fast.  Start or continue an exercise plan as told by your doctor.  Drink enough fluids to keep your pee clear or pale yellow.  Take over-the-counter and prescription medicines only as told by your doctor.  Keep all follow-up visits as told by your doctor. This is important. Contact a doctor if:  You have another gout attack.  You still have symptoms of a gout attack after10 days of treatment.  You have problems (side effects) because of your medicines.  You have chills or a fever.  You have burning pain when you pee (urinate).  You have pain in your lower back or belly. Get help right away if:  You have very bad pain.  Your pain cannot be controlled.  You cannot pee. This information is not intended to replace advice given to you by your health care provider. Make sure you discuss any questions you have with your health care provider. Document Released: 12/25/2007 Document Revised: 08/23/2015 Document Reviewed: 12/28/2014 Elsevier Interactive Patient Education  [employer].

## 2017-05-25 NOTE — Progress Notes (Signed)
Subjective:     Patient ID: Samuel Oliver, male   DOB: September 27, 1965, 52 y.o.   MRN: [id number] Chief Complaint  Patient presents with  . Foot Pain    Patient comes into office today with complaints of right foot pain and swelling for one month. Patient reports that pain originally started in his great toe and has now moved to top of his foot, patient descibes it as a sharp pain when walking.    HPI Reports no hx of gout or injury. Occasional alcohol use with no family hx of inflammatory arthritis. Has been taking ibuprofen 600 mg.daily and cherry juice with improvement in his sx.   Review of Systems     Objective:   Physical Exam  Constitutional: He appears well-developed and well-nourished.  Cardiovascular:  Pulses:      Dorsalis pedis pulses are 2+ on the right side.       Posterior tibial pulses are 2+ on the right side.  Musculoskeletal:  Right ankle and foot ligaments stable. No erythema with mild tenderness over the dorsum of his foot just proximal to his toes.       Assessment:    1. Foot pain, right: will treat presumptively for gout with 5 day course of prednisone. - Sedimentation rate - Renal function panel - Uric acid    Plan:    Further f/u pending lab results. Gout handout provided.

## 2017-05-26 ENCOUNTER — Telehealth: Payer: Self-pay

## 2017-05-26 LAB — RENAL FUNCTION PANEL
ALBUMIN: 4.6 g/dL (ref 3.5–5.5)
BUN/Creatinine Ratio: 14 (ref 9–20)
BUN: 14 mg/dL (ref 6–24)
CALCIUM: 9.3 mg/dL (ref 8.7–10.2)
CO2: 23 mmol/L (ref 20–29)
Chloride: 106 mmol/L (ref 96–106)
Creatinine, Ser: 1.03 mg/dL (ref 0.76–1.27)
GFR calc Af Amer: 95 mL/min/{1.73_m2} (ref >59)
GFR calc non Af Amer: 83 mL/min/{1.73_m2} (ref >59)
GLUCOSE: 91 mg/dL (ref 65–99)
POTASSIUM: 3.5 mmol/L (ref 3.5–5.2)
Phosphorus: 3.6 mg/dL (ref 2.5–4.5)
Sodium: 145 mmol/L — ABNORMAL HIGH (ref 134–144)

## 2017-05-26 LAB — SEDIMENTATION RATE: Sed Rate: 14 mm/hr (ref 0–30)

## 2017-05-26 LAB — URIC ACID: Uric Acid: 4.5 mg/dL (ref 3.7–8.6)

## 2017-05-26 NOTE — Telephone Encounter (Signed)
-----   Message from Carmon Ginsberg, [place] sent at 05/26/2017  7:23 AM EST ----- Labs look good with a low uric acid level. Not sure this is gout. Let me know if not improved with prednisone.

## 2017-05-26 NOTE — Telephone Encounter (Signed)
Patient advised he states that he feels 50% better since starting prednisone yesterday, patient will give [place] a call back if symptoms have not cleared after completing medication. KW

## 2017-06-22 ENCOUNTER — Ambulatory Visit (INDEPENDENT_AMBULATORY_CARE_PROVIDER_SITE_OTHER): Payer: Federal, State, Local not specified - PPO | Admitting: Family Medicine

## 2017-06-22 ENCOUNTER — Encounter: Payer: Self-pay | Admitting: Family Medicine

## 2017-06-22 VITALS — BP 120/98 | HR 90 | Temp 98.4°F | Resp 16 | Wt 212.0 lb

## 2017-06-22 DIAGNOSIS — K219 Gastro-esophageal reflux disease without esophagitis: Secondary | ICD-10-CM | POA: Diagnosis not present

## 2017-06-22 MED ORDER — SUCRALFATE 1 G PO TABS: 1.0000 g | ORAL_TABLET | Freq: Three times a day (TID) | ORAL | 0 refills | Status: DC

## 2017-06-22 NOTE — Patient Instructions (Signed)
Let me know if you are not improving.

## 2017-06-22 NOTE — Progress Notes (Signed)
Subjective:     Patient ID: NATHANYL ANDUJO, male   DOB: May 24, 1965, 52 y.o.   MRN: [id number] Chief Complaint  Patient presents with  . Heartburn    Patient comes into office today with complaints of heartburn that began yesterday after eating lunch. Patient reports that he has hasd belching and regurgitation. Patient has tried taking otc Pepcid and Tums   HPI States it started after he ate [place] food. He is no longer on omeprazole daily.  Review of Systems     Objective:   Physical Exam  Constitutional: He appears well-developed and well-nourished. No distress.  Abdominal: Soft. There is tenderness (mild epigastric tenderness). There is guarding.       Assessment:    1. Gastroesophageal reflux disease without esophagitis: Samples of Dexilant 60 mg. #15. - sucralfate (CARAFATE) 1 g tablet; Take 1 tablet (1 g total) by mouth 4 (four) times daily -  with meals and at bedtime.  Dispense: 28 tablet; Refill: 0    Plan:    Further f/u if not improving.

## 2017-07-21 DIAGNOSIS — K080 Exfoliation of teeth due to systemic causes: Secondary | ICD-10-CM | POA: Diagnosis not present

## 2017-08-17 DIAGNOSIS — H9041 Sensorineural hearing loss, unilateral, right ear, with unrestricted hearing on the contralateral side: Secondary | ICD-10-CM | POA: Diagnosis not present

## 2017-08-17 DIAGNOSIS — H90A21 Sensorineural hearing loss, unilateral, right ear, with restricted hearing on the contralateral side: Secondary | ICD-10-CM | POA: Diagnosis not present

## 2017-09-14 ENCOUNTER — Encounter: Payer: Self-pay | Admitting: Family Medicine

## 2017-09-14 ENCOUNTER — Ambulatory Visit: Payer: Federal, State, Local not specified - PPO | Admitting: Family Medicine

## 2017-09-14 VITALS — BP 150/100 | HR 93 | Temp 98.3°F | Resp 16 | Wt 226.0 lb

## 2017-09-14 DIAGNOSIS — I10 Essential (primary) hypertension: Secondary | ICD-10-CM | POA: Diagnosis not present

## 2017-09-14 DIAGNOSIS — K219 Gastro-esophageal reflux disease without esophagitis: Secondary | ICD-10-CM | POA: Diagnosis not present

## 2017-09-14 DIAGNOSIS — Z125 Encounter for screening for malignant neoplasm of prostate: Secondary | ICD-10-CM

## 2017-09-14 DIAGNOSIS — Z1283 Encounter for screening for malignant neoplasm of skin: Secondary | ICD-10-CM

## 2017-09-14 DIAGNOSIS — Z13220 Encounter for screening for lipoid disorders: Secondary | ICD-10-CM | POA: Diagnosis not present

## 2017-09-14 MED ORDER — AMLODIPINE BESYLATE 10 MG PO TABS: 10.0000 mg | ORAL_TABLET | Freq: Every day | ORAL | 0 refills | Status: DC

## 2017-09-14 MED ORDER — OMEPRAZOLE 20 MG PO CPDR: DELAYED_RELEASE_CAPSULE | ORAL | 1 refills | Status: DC

## 2017-09-14 NOTE — Patient Instructions (Addendum)
We will call you with the lab results and the dermatology referral.

## 2017-09-14 NOTE — Progress Notes (Signed)
  Subjective:     Patient ID: Samuel Oliver, male   DOB: 12-03-1965, 52 y.o.   MRN: [id number] Chief Complaint  Patient presents with  . Gastroesophageal Reflux   HPI States he wishes to get back on omeprazole as he did not get good control with Zantac or similar in the past. Reports he feels like food gets stuck in his lower esophagus. Reports compliance with amlodipine. Wishes to update labs esp. PSA.Marland Kitchen Also wishes skin survey per dermatology due to hx of fair skin.  Review of Systems     Objective:   Physical Exam  Constitutional: He appears well-developed and well-nourished. No distress.  Cardiovascular: Normal rate and regular rhythm.  Pulmonary/Chest: Breath sounds normal.  Abdominal: Soft. There is tenderness ( mild epigatric).  Musculoskeletal: He exhibits no edema (of lower extremities).       Assessment:    1. Essential hypertension: increase amlodipine dose - Comprehensive metabolic panel  2. Gastroesophageal reflux disease without esophagitis: resume omeprazole  3. Screening for prostate cancer - PSA  4. Screening for cholesterol level - Lipid panel  5. Screening for skin cancer - Ambulatory referral to Dermatology    Plan:    F/u pending lab results and in 4 weeks.

## 2017-09-17 DIAGNOSIS — Z125 Encounter for screening for malignant neoplasm of prostate: Secondary | ICD-10-CM | POA: Diagnosis not present

## 2017-09-17 DIAGNOSIS — I10 Essential (primary) hypertension: Secondary | ICD-10-CM | POA: Diagnosis not present

## 2017-09-17 DIAGNOSIS — Z13220 Encounter for screening for lipoid disorders: Secondary | ICD-10-CM | POA: Diagnosis not present

## 2017-09-18 ENCOUNTER — Telehealth: Payer: Self-pay

## 2017-09-18 LAB — PSA: Prostate Specific Ag, Serum: 0.8 ng/mL (ref 0.0–4.0)

## 2017-09-18 LAB — LIPID PANEL
CHOL/HDL RATIO: 7.2 ratio — AB (ref 0.0–5.0)
Cholesterol, Total: 180 mg/dL (ref 100–199)
HDL: 25 mg/dL — ABNORMAL LOW (ref >39)
LDL CALC: 126 mg/dL — AB (ref 0–99)
Triglycerides: 147 mg/dL (ref 0–149)
VLDL Cholesterol Cal: 29 mg/dL (ref 5–40)

## 2017-09-18 LAB — COMPREHENSIVE METABOLIC PANEL
ALT: 16 IU/L (ref 0–44)
AST: 15 IU/L (ref 0–40)
Albumin/Globulin Ratio: 2 (ref 1.2–2.2)
Albumin: 4.6 g/dL (ref 3.5–5.5)
Alkaline Phosphatase: 73 IU/L (ref 39–117)
BUN/Creatinine Ratio: 17 (ref 9–20)
BUN: 18 mg/dL (ref 6–24)
Bilirubin Total: 0.5 mg/dL (ref 0.0–1.2)
CALCIUM: 8.9 mg/dL (ref 8.7–10.2)
CO2: 21 mmol/L (ref 20–29)
CREATININE: 1.08 mg/dL (ref 0.76–1.27)
Chloride: 107 mmol/L — ABNORMAL HIGH (ref 96–106)
GFR calc Af Amer: 89 mL/min/{1.73_m2} (ref >59)
GFR, EST NON AFRICAN AMERICAN: 77 mL/min/{1.73_m2} (ref >59)
Globulin, Total: 2.3 g/dL (ref 1.5–4.5)
Glucose: 100 mg/dL — ABNORMAL HIGH (ref 65–99)
Potassium: 3.5 mmol/L (ref 3.5–5.2)
Sodium: 143 mmol/L (ref 134–144)
Total Protein: 6.9 g/dL (ref 6.0–8.5)

## 2017-09-18 NOTE — Telephone Encounter (Signed)
Patient advised.KW 

## 2017-09-18 NOTE — Telephone Encounter (Signed)
-----   Message from Carmon Ginsberg, [place] sent at 09/18/2017  7:29 AM EDT ----- Labs stable. Cholesterol is still mildly elevated. We can calculate your 10 year risk after we get your bp under better control.

## 2017-10-13 ENCOUNTER — Telehealth: Payer: Self-pay | Admitting: Pain Medicine

## 2017-10-13 NOTE — Telephone Encounter (Signed)
Error

## 2017-10-14 ENCOUNTER — Ambulatory Visit: Payer: Self-pay | Admitting: Family Medicine

## 2017-10-14 ENCOUNTER — Encounter: Payer: Self-pay | Admitting: Family Medicine

## 2017-10-14 ENCOUNTER — Ambulatory Visit: Payer: Federal, State, Local not specified - PPO | Admitting: Family Medicine

## 2017-10-14 VITALS — BP 122/84 | HR 84 | Temp 98.5°F | Resp 16 | Wt 223.0 lb

## 2017-10-14 DIAGNOSIS — I10 Essential (primary) hypertension: Secondary | ICD-10-CM | POA: Diagnosis not present

## 2017-10-14 DIAGNOSIS — E782 Mixed hyperlipidemia: Secondary | ICD-10-CM

## 2017-10-14 DIAGNOSIS — Z91018 Allergy to other foods: Secondary | ICD-10-CM | POA: Insufficient documentation

## 2017-10-14 NOTE — Patient Instructions (Signed)
Mediterranean Diet A Mediterranean diet refers to food and lifestyle choices that are based on the traditions of countries located on the [place] Sea. This way of eating has been shown to help prevent certain conditions and improve outcomes for people who have chronic diseases, like kidney disease and heart disease. What are tips for following this plan? Lifestyle  Cook and eat meals together with your family, when possible.  Drink enough fluid to keep your urine clear or pale yellow.  Be physically active every day. This includes: ? Aerobic exercise like running or swimming. ? Leisure activities like gardening, walking, or housework.  Get 7-8 hours of sleep each night.  If recommended by your health care provider, drink red wine in moderation. This means 1 glass a day for nonpregnant women and 2 glasses a day for men. A glass of wine equals 5 oz (150 mL). Reading food labels  Check the serving size of packaged foods. For foods such as rice and pasta, the serving size refers to the amount of cooked product, not dry.  Check the total fat in packaged foods. Avoid foods that have saturated fat or trans fats.  Check the ingredients list for added sugars, such as corn syrup. Shopping  At the grocery store, buy most of your food from the areas near the walls of the store. This includes: ? Fresh fruits and vegetables (produce). ? Grains, beans, nuts, and seeds. Some of these may be available in unpackaged forms or large amounts (in bulk). ? Fresh seafood. ? Poultry and eggs. ? Low-fat dairy products.  Buy whole ingredients instead of prepackaged foods.  Buy fresh fruits and vegetables in-season from local farmers markets.  Buy frozen fruits and vegetables in resealable bags.  If you do not have access to quality fresh seafood, buy precooked frozen shrimp or canned fish, such as tuna, salmon, or sardines.  Buy small amounts of raw or cooked vegetables, salads, or olives from the  deli or salad bar at your store.  Stock your pantry so you always have certain foods on hand, such as olive oil, canned tuna, canned tomatoes, rice, pasta, and beans. Cooking  Cook foods with extra-virgin olive oil instead of using butter or other vegetable oils.  Have meat as a side dish, and have vegetables or grains as your main dish. This means having meat in small portions or adding small amounts of meat to foods like pasta or stew.  Use beans or vegetables instead of meat in common dishes like chili or lasagna.  Experiment with different cooking methods. Try roasting or broiling vegetables instead of steaming or sauteing them.  Add frozen vegetables to soups, stews, pasta, or rice.  Add nuts or seeds for added healthy fat at each meal. You can add these to yogurt, salads, or vegetable dishes.  Marinate fish or vegetables using olive oil, lemon juice, garlic, and fresh herbs. Meal planning  Plan to eat 1 vegetarian meal one day each week. Try to work up to 2 vegetarian meals, if possible.  Eat seafood 2 or more times a week.  Have healthy snacks readily available, such as: ? Vegetable sticks with hummus. ? Greek yogurt. ? Fruit and nut trail mix.  Eat balanced meals throughout the week. This includes: ? Fruit: 2-3 servings a day ? Vegetables: 4-5 servings a day ? Low-fat dairy: 2 servings a day ? Fish, poultry, or lean meat: 1 serving a day ? Beans and legumes: 2 or more servings a week ? Nuts   and seeds: 1-2 servings a day ? Whole grains: 6-8 servings a day ? Extra-virgin olive oil: 3-4 servings a day  Limit red meat and sweets to only a few servings a month What are my food choices?  Mediterranean diet ? Recommended ? Grains: Whole-grain pasta. Brown rice. Bulgar wheat. Polenta. Couscous. Whole-wheat bread. Oatmeal. Quinoa. ? Vegetables: Artichokes. Beets. Broccoli. Cabbage. Carrots. Eggplant. Green beans. Chard. Kale. Spinach. Onions. Leeks. Peas. Squash.  Tomatoes. Peppers. Radishes. ? Fruits: Apples. Apricots. Avocado. Berries. Bananas. Cherries. Dates. Figs. Grapes. Lemons. Melon. Oranges. Peaches. Plums. Pomegranate. ? Meats and other protein foods: Beans. Almonds. Sunflower seeds. Pine nuts. Peanuts. Cod. Salmon. Scallops. Shrimp. Tuna. Tilapia. Clams. Oysters. Eggs. ? Dairy: Low-fat milk. Cheese. Greek yogurt. ? Beverages: Water. Red wine. Herbal tea. ? Fats and oils: Extra virgin olive oil. Avocado oil. Grape seed oil. ? Sweets and desserts: Greek yogurt with honey. Baked apples. Poached pears. Trail mix. ? Seasoning and other foods: Basil. Cilantro. Coriander. Cumin. Mint. Parsley. Sage. Rosemary. Tarragon. Garlic. Oregano. Thyme. Pepper. Balsalmic vinegar. Tahini. Hummus. Tomato sauce. Olives. Mushrooms. ? Limit these ? Grains: Prepackaged pasta or rice dishes. Prepackaged cereal with added sugar. ? Vegetables: Deep fried potatoes (french fries). ? Fruits: Fruit canned in syrup. ? Meats and other protein foods: Beef. Pork. Lamb. Poultry with skin. Hot dogs. Bacon. ? Dairy: Ice cream. Sour cream. Whole milk. ? Beverages: Juice. Sugar-sweetened soft drinks. Beer. Liquor and spirits. ? Fats and oils: Butter. Canola oil. Vegetable oil. Beef fat (tallow). Lard. ? Sweets and desserts: Cookies. Cakes. Pies. Candy. ? Seasoning and other foods: Mayonnaise. Premade sauces and marinades. ? The items listed may not be a complete list. Talk with your dietitian about what dietary choices are right for you. Summary  The Mediterranean diet includes both food and lifestyle choices.  Eat a variety of fresh fruits and vegetables, beans, nuts, seeds, and whole grains.  Limit the amount of red meat and sweets that you eat.  Talk with your health care provider about whether it is safe for you to drink red wine in moderation. This means 1 glass a day for nonpregnant women and 2 glasses a day for men. A glass of wine equals 5 oz (150 mL). This information  is not intended to replace advice given to you by your health care provider. Make sure you discuss any questions you have with your health care provider. Document Released: 11/08/2015 Document Revised: 12/11/2015 Document Reviewed: 11/08/2015 Elsevier Interactive Patient Education  2018 Elsevier Inc.  

## 2017-10-14 NOTE — Progress Notes (Signed)
  Subjective:     Patient ID: Samuel Oliver, male   DOB: 25-Dec-1965, 52 y.o.   MRN: [id number] Chief Complaint  Patient presents with  . Hypertension    One month follow - up.  Increased Amlodipine at last visit.    HPI States he is tolerating medication. I have calculated his 10 year c.v.risk with today's blood pressure: 9%. Paternal hx of CAD. He prefers to adjust his diet and will continue to exercise regularly.  Review of Systems     Objective:   Physical Exam  Constitutional: He appears well-developed and well-nourished. No distress.  Cardiovascular: Normal rate and regular rhythm.  Pulmonary/Chest: Breath sounds normal.  Musculoskeletal: He exhibits no edema (of lower extremities).       Assessment:    1. Essential hypertension: continue amlodipine  2. Mixed hyperlipidemia    Plan:    Handout regarding Mediterranean diet. Will recheck cholesterol in 6 months.

## 2017-10-21 DIAGNOSIS — K080 Exfoliation of teeth due to systemic causes: Secondary | ICD-10-CM | POA: Diagnosis not present

## 2017-11-24 DIAGNOSIS — L820 Inflamed seborrheic keratosis: Secondary | ICD-10-CM | POA: Diagnosis not present

## 2017-11-24 DIAGNOSIS — D485 Neoplasm of uncertain behavior of skin: Secondary | ICD-10-CM | POA: Diagnosis not present

## 2017-11-24 DIAGNOSIS — B078 Other viral warts: Secondary | ICD-10-CM | POA: Diagnosis not present

## 2017-11-24 DIAGNOSIS — D235 Other benign neoplasm of skin of trunk: Secondary | ICD-10-CM | POA: Diagnosis not present

## 2017-12-10 ENCOUNTER — Other Ambulatory Visit: Payer: Self-pay | Admitting: Family Medicine

## 2017-12-10 ENCOUNTER — Telehealth: Payer: Self-pay | Admitting: Family Medicine

## 2017-12-10 MED ORDER — AMLODIPINE BESYLATE 10 MG PO TABS: 10.0000 mg | ORAL_TABLET | Freq: Every day | ORAL | 3 refills | Status: DC

## 2017-12-10 NOTE — Telephone Encounter (Signed)
Last filled 09/14/17 please review. KW

## 2017-12-10 NOTE — Telephone Encounter (Signed)
[facility] faxed a refill request for the following medication. Thanks CC  amLODipine (NORVASC) 10 MG tablet

## 2017-12-10 NOTE — Telephone Encounter (Signed)
done

## 2018-01-27 DIAGNOSIS — K080 Exfoliation of teeth due to systemic causes: Secondary | ICD-10-CM | POA: Diagnosis not present

## 2018-03-05 ENCOUNTER — Other Ambulatory Visit: Payer: Self-pay | Admitting: Family Medicine

## 2018-03-15 DIAGNOSIS — H903 Sensorineural hearing loss, bilateral: Secondary | ICD-10-CM | POA: Diagnosis not present

## 2018-03-15 DIAGNOSIS — H9041 Sensorineural hearing loss, unilateral, right ear, with unrestricted hearing on the contralateral side: Secondary | ICD-10-CM | POA: Diagnosis not present

## 2018-04-06 DIAGNOSIS — S86111A Strain of other muscle(s) and tendon(s) of posterior muscle group at lower leg level, right leg, initial encounter: Secondary | ICD-10-CM | POA: Diagnosis not present

## 2018-04-16 ENCOUNTER — Ambulatory Visit: Payer: Federal, State, Local not specified - PPO | Admitting: Family Medicine

## 2018-04-27 DIAGNOSIS — S86111D Strain of other muscle(s) and tendon(s) of posterior muscle group at lower leg level, right leg, subsequent encounter: Secondary | ICD-10-CM | POA: Diagnosis not present

## 2018-04-29 DIAGNOSIS — K080 Exfoliation of teeth due to systemic causes: Secondary | ICD-10-CM | POA: Diagnosis not present

## 2018-06-04 DIAGNOSIS — E782 Mixed hyperlipidemia: Secondary | ICD-10-CM | POA: Diagnosis not present

## 2018-06-04 DIAGNOSIS — K219 Gastro-esophageal reflux disease without esophagitis: Secondary | ICD-10-CM | POA: Diagnosis not present

## 2018-06-04 DIAGNOSIS — I10 Essential (primary) hypertension: Secondary | ICD-10-CM | POA: Diagnosis not present

## 2018-06-04 DIAGNOSIS — G4733 Obstructive sleep apnea (adult) (pediatric): Secondary | ICD-10-CM | POA: Diagnosis not present

## 2018-06-15 DIAGNOSIS — G4733 Obstructive sleep apnea (adult) (pediatric): Secondary | ICD-10-CM | POA: Diagnosis not present

## 2018-06-15 DIAGNOSIS — R0602 Shortness of breath: Secondary | ICD-10-CM | POA: Diagnosis not present

## 2018-06-16 DIAGNOSIS — R0602 Shortness of breath: Secondary | ICD-10-CM | POA: Diagnosis not present

## 2018-06-16 DIAGNOSIS — G4733 Obstructive sleep apnea (adult) (pediatric): Secondary | ICD-10-CM | POA: Diagnosis not present

## 2018-06-30 DIAGNOSIS — G4733 Obstructive sleep apnea (adult) (pediatric): Secondary | ICD-10-CM | POA: Diagnosis not present

## 2018-08-27 ENCOUNTER — Other Ambulatory Visit: Payer: Self-pay | Admitting: Family Medicine

## 2018-08-27 DIAGNOSIS — K219 Gastro-esophageal reflux disease without esophagitis: Secondary | ICD-10-CM

## 2018-08-27 MED ORDER — OMEPRAZOLE 20 MG PO CPDR: DELAYED_RELEASE_CAPSULE | ORAL | 0 refills | Status: DC

## 2018-08-27 NOTE — Telephone Encounter (Signed)
Going to need to schedule appt with new provider in next 3 months.

## 2018-08-27 NOTE — Telephone Encounter (Signed)
[facility] faxed refill request for the following medications:  omeprazole (PRILOSEC) 20 MG capsule  90 day supply  Pt was seeing Mikki Santee. Please advise. Thanks TNP

## 2018-08-31 NOTE — Telephone Encounter (Signed)
Patient advise as directed below.

## 2018-09-02 DIAGNOSIS — K219 Gastro-esophageal reflux disease without esophagitis: Secondary | ICD-10-CM | POA: Diagnosis not present

## 2018-09-02 DIAGNOSIS — E782 Mixed hyperlipidemia: Secondary | ICD-10-CM | POA: Diagnosis not present

## 2018-09-02 DIAGNOSIS — G4733 Obstructive sleep apnea (adult) (pediatric): Secondary | ICD-10-CM | POA: Diagnosis not present

## 2018-09-02 DIAGNOSIS — I10 Essential (primary) hypertension: Secondary | ICD-10-CM | POA: Diagnosis not present

## 2018-09-27 ENCOUNTER — Other Ambulatory Visit: Payer: Self-pay | Admitting: Physician Assistant

## 2018-09-27 DIAGNOSIS — K219 Gastro-esophageal reflux disease without esophagitis: Secondary | ICD-10-CM

## 2018-10-12 ENCOUNTER — Encounter: Payer: Self-pay | Admitting: Family Medicine

## 2018-10-12 ENCOUNTER — Ambulatory Visit: Payer: Federal, State, Local not specified - PPO | Admitting: Family Medicine

## 2018-10-12 ENCOUNTER — Other Ambulatory Visit: Payer: Self-pay

## 2018-10-12 VITALS — BP 146/82 | HR 65 | Temp 98.2°F | Wt 213.0 lb

## 2018-10-12 DIAGNOSIS — Z905 Acquired absence of kidney: Secondary | ICD-10-CM

## 2018-10-12 DIAGNOSIS — M545 Low back pain, unspecified: Secondary | ICD-10-CM

## 2018-10-12 LAB — POCT URINALYSIS DIPSTICK
Bilirubin, UA: NEGATIVE
Blood, UA: NEGATIVE
Glucose, UA: NEGATIVE
Ketones, UA: NEGATIVE
Leukocytes, UA: NEGATIVE
Nitrite, UA: NEGATIVE
Protein, UA: NEGATIVE
Spec Grav, UA: 1.02 (ref 1.010–1.025)
Urobilinogen, UA: 0.2 E.U./dL
pH, UA: 6.5 (ref 5.0–8.0)

## 2018-10-12 NOTE — Progress Notes (Signed)
Samuel Oliver  MRN: [id number] DOB: 20-Apr-1965  Subjective:  HPI   The patient is a 53 year old male who presents with complaint of low back pain in the kidney area.  He states that he has some mild discomfort when he urinates and some increased frequency.  His symptoms have been going on for 4-5 days. He had kidney surgery in 2013 and is very cautious when he has any symptoms  Patient Active Problem List   Diagnosis Date Noted  . Allergy to alpha-gal 10/14/2017  . Mixed hyperlipidemia 10/14/2017  . GERD (gastroesophageal reflux disease) 10/30/2016  . Allergic rhinitis, seasonal 07/10/2016  . OSA (obstructive sleep apnea) 12/13/2015  . Encounter for screening colonoscopy 09/21/2014  . Hypertension 09/06/2014   Past Medical History:  Diagnosis Date  . Cancer [facility] Feb 2014   left kidney  . Diverticulitis 2014  . GERD (gastroesophageal reflux disease)   . Hypertension   . Sleep apnea 2000   CPAP   Past Surgical History:  Procedure Laterality Date  . COLONOSCOPY N/A 11/08/2014   Procedure: COLONOSCOPY;  Surgeon: Robert Bellow, MD;  Location: [facility] ENDOSCOPY;  Service: Endoscopy;  Laterality: N/A;  . KIDNEY SURGERY Left 05/2012   [facility] partial removal    Family History  Problem Relation Age of Onset  . Heart disease Father    Social History   Socioeconomic History  . Marital status: Married    Spouse name: Not on file  . Number of children: Not on file  . Years of education: Not on file  . Highest education level: Not on file  Occupational History  . Not on file  Social Needs  . Financial resource strain: Not on file  . Food insecurity    Worry: Not on file    Inability: Not on file  . Transportation needs    Medical: Not on file    Non-medical: Not on file  Tobacco Use  . Smoking status: Never Smoker  . Smokeless tobacco: Never Used  Substance and Sexual Activity  . Alcohol use: Yes    Alcohol/week: 0.0 standard drinks  . Drug use: No  . Sexual activity:  Not on file  Lifestyle  . Physical activity    Days per week: Not on file    Minutes per session: Not on file  . Stress: Not on file  Relationships  . Social Herbalist on phone: Not on file    Gets together: Not on file    Attends religious service: Not on file    Active member of club or organization: Not on file    Attends meetings of clubs or organizations: Not on file    Relationship status: Not on file  . Intimate partner violence    Fear of current or ex partner: Not on file    Emotionally abused: Not on file    Physically abused: Not on file    Forced sexual activity: Not on file  Other Topics Concern  . Not on file  Social History Narrative  . Not on file   Outpatient Encounter Medications as of 10/12/2018  Medication Sig  . amLODipine (NORVASC) 10 MG tablet Take 1 tablet (10 mg total) by mouth daily.  Marland Kitchen EPINEPHrine 0.1 MG/0.1ML SOAJ Inject 1 Dose as directed once as needed.  . hydrochlorothiazide (MICROZIDE) 12.5 MG capsule Take 12.5 mg by mouth daily.  . metoprolol succinate (TOPROL-XL) 25 MG 24 hr tablet Take 25 mg by mouth daily.  Marland Kitchen  omeprazole (PRILOSEC) 20 MG capsule TAKE 1 CAPSULE BY MOUTH DAILY AS NEEDED FOR HEARTBURN OR REFULX  . aspirin 81 MG EC tablet   . sucralfate (CARAFATE) 1 g tablet Take 1 tablet (1 g total) by mouth 4 (four) times daily -  with meals and at bedtime. (Patient not taking: Reported on 10/14/2017)   No facility-administered encounter medications on file as of 10/12/2018.    Allergies  Allergen Reactions  . Beef Extract Anaphylaxis  . 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 Pork (Porcine) Protein Anaphylaxis   Review of Systems  Genitourinary: Positive for dysuria, flank pain and frequency. Negative for hematuria and urgency.  Musculoskeletal: Positive for back pain.    Objective:  BP (!) 146/82 (BP Location: Right Arm, Patient Position: Sitting, Cuff Size: Normal)   Pulse 65   Temp 98.2 F (36.8 C) (Oral)    Wt 213 lb (96.6 kg)   SpO2 99%   BMI 29.71 kg/m   Physical Exam  Constitutional: He is oriented to person, place, and time and well-developed, well-nourished, and in no distress.  HENT:  Head: Normocephalic.  Eyes: Conjunctivae are normal.  Neck: Neck supple.  Cardiovascular: Normal rate.  Pulmonary/Chest: Effort normal and breath sounds normal.  Abdominal: Soft. Bowel sounds are normal.  Musculoskeletal: Normal range of motion.  Neurological: He is alert and oriented to person, place, and time.  Skin: No rash noted.  Psychiatric: Mood, affect and judgment normal.    Assessment and Plan :   1. Left-sided low back pain without sciatica, unspecified chronicity Onset the past 4-5 days with some questionable urinary frequency. No hematuria and urinalysis not remarkable. Will check C&S for any infection beginning. Suspect this is a muscular strain without radiation to extremities. May have occurred with regular exercise program 3-4 days a week at the gym. No fever or CVA tenderness today. Check labs and follow up pending reports. May use moist heat applications and limit strenuous activities. - POCT urinalysis dipstick - CBC with Differential/Platelet - Comprehensive metabolic panel - CULTURE, URINE COMPREHENSIVE  2. History of partial nephrectomy Had a partial left nephrectomy in 2013 by Dr. Thurmond Butts [facility] urologist) for renal cell carcinoma. Has plans for follow up on 10-25-18 with ultrasound evaluation. With recent symptoms, will check CBC and CMP to assess for infection and renal function. - CBC with Differential/Platelet - Comprehensive metabolic panel

## 2018-10-13 LAB — CBC WITH DIFFERENTIAL/PLATELET
Basophils Absolute: 0 10*3/uL (ref 0.0–0.2)
Basos: 1 %
EOS (ABSOLUTE): 0.1 10*3/uL (ref 0.0–0.4)
Eos: 2 %
Hematocrit: 43.6 % (ref 37.5–51.0)
Hemoglobin: 14.3 g/dL (ref 13.0–17.7)
Immature Grans (Abs): 0 10*3/uL (ref 0.0–0.1)
Immature Granulocytes: 0 %
Lymphocytes Absolute: 1.2 10*3/uL (ref 0.7–3.1)
Lymphs: 24 %
MCH: 27.7 pg (ref 26.6–33.0)
MCHC: 32.8 g/dL (ref 31.5–35.7)
MCV: 84 fL (ref 79–97)
Monocytes Absolute: 0.6 10*3/uL (ref 0.1–0.9)
Monocytes: 12 %
Neutrophils Absolute: 3.1 10*3/uL (ref 1.4–7.0)
Neutrophils: 61 %
Platelets: 194 10*3/uL (ref 150–450)
RBC: 5.17 x10E6/uL (ref 4.14–5.80)
RDW: 13.4 % (ref 11.6–15.4)
WBC: 5 10*3/uL (ref 3.4–10.8)

## 2018-10-13 LAB — COMPREHENSIVE METABOLIC PANEL
ALT: 13 IU/L (ref 0–44)
AST: 15 IU/L (ref 0–40)
Albumin/Globulin Ratio: 2 (ref 1.2–2.2)
Albumin: 4.7 g/dL (ref 3.8–4.9)
Alkaline Phosphatase: 64 IU/L (ref 39–117)
BUN/Creatinine Ratio: 15 (ref 9–20)
BUN: 21 mg/dL (ref 6–24)
Bilirubin Total: 0.6 mg/dL (ref 0.0–1.2)
CO2: 25 mmol/L (ref 20–29)
Calcium: 10.1 mg/dL (ref 8.7–10.2)
Chloride: 105 mmol/L (ref 96–106)
Creatinine, Ser: 1.36 mg/dL — ABNORMAL HIGH (ref 0.76–1.27)
GFR calc Af Amer: 67 mL/min/{1.73_m2} (ref >59)
GFR calc non Af Amer: 58 mL/min/{1.73_m2} — ABNORMAL LOW (ref >59)
Globulin, Total: 2.4 g/dL (ref 1.5–4.5)
Glucose: 111 mg/dL — ABNORMAL HIGH (ref 65–99)
Potassium: 4.2 mmol/L (ref 3.5–5.2)
Sodium: 144 mmol/L (ref 134–144)
Total Protein: 7.1 g/dL (ref 6.0–8.5)

## 2018-10-14 LAB — SPECIMEN STATUS REPORT

## 2018-10-14 LAB — CULTURE, URINE COMPREHENSIVE

## 2018-10-25 DIAGNOSIS — C642 Malignant neoplasm of left kidney, except renal pelvis: Secondary | ICD-10-CM | POA: Diagnosis not present

## 2018-10-25 DIAGNOSIS — Z6829 Body mass index (BMI) 29.0-29.9, adult: Secondary | ICD-10-CM | POA: Diagnosis not present

## 2018-10-25 DIAGNOSIS — N281 Cyst of kidney, acquired: Secondary | ICD-10-CM | POA: Diagnosis not present

## 2018-10-25 DIAGNOSIS — C649 Malignant neoplasm of unspecified kidney, except renal pelvis: Secondary | ICD-10-CM | POA: Diagnosis not present

## 2018-10-25 DIAGNOSIS — R350 Frequency of micturition: Secondary | ICD-10-CM | POA: Diagnosis not present

## 2018-10-25 DIAGNOSIS — Z08 Encounter for follow-up examination after completed treatment for malignant neoplasm: Secondary | ICD-10-CM | POA: Diagnosis not present

## 2018-10-25 DIAGNOSIS — Z905 Acquired absence of kidney: Secondary | ICD-10-CM | POA: Diagnosis not present

## 2018-10-25 DIAGNOSIS — Z85528 Personal history of other malignant neoplasm of kidney: Secondary | ICD-10-CM | POA: Diagnosis not present

## 2018-11-23 ENCOUNTER — Other Ambulatory Visit: Payer: Self-pay | Admitting: Family Medicine

## 2018-11-23 DIAGNOSIS — D485 Neoplasm of uncertain behavior of skin: Secondary | ICD-10-CM | POA: Diagnosis not present

## 2018-11-23 DIAGNOSIS — D2261 Melanocytic nevi of right upper limb, including shoulder: Secondary | ICD-10-CM | POA: Diagnosis not present

## 2018-11-23 DIAGNOSIS — D2272 Melanocytic nevi of left lower limb, including hip: Secondary | ICD-10-CM | POA: Diagnosis not present

## 2018-11-23 DIAGNOSIS — D2262 Melanocytic nevi of left upper limb, including shoulder: Secondary | ICD-10-CM | POA: Diagnosis not present

## 2018-11-23 DIAGNOSIS — D225 Melanocytic nevi of trunk: Secondary | ICD-10-CM | POA: Diagnosis not present

## 2018-11-23 MED ORDER — AMLODIPINE BESYLATE 10 MG PO TABS: 10.0000 mg | ORAL_TABLET | Freq: Every day | ORAL | 3 refills | Status: DC

## 2018-11-23 NOTE — Telephone Encounter (Signed)
[facility] faxed refill request for the following medications:  amLODipine (NORVASC) 10 MG tablet    Please advise.  

## 2018-12-14 DIAGNOSIS — D485 Neoplasm of uncertain behavior of skin: Secondary | ICD-10-CM | POA: Diagnosis not present

## 2018-12-25 ENCOUNTER — Other Ambulatory Visit: Payer: Self-pay | Admitting: Physician Assistant

## 2018-12-25 DIAGNOSIS — K219 Gastro-esophageal reflux disease without esophagitis: Secondary | ICD-10-CM

## 2019-01-05 IMAGING — US US SCROTUM
1 series · 13 of 25 positions shown · non-contrast
Comparison: [DATE] by 1.7 x 2.3 cm

CLINICAL DATA: Left scrotal swelling.  History of renal malignancy.

EXAM:
SCROTAL ULTRASOUND
DOPPLER ULTRASOUND OF THE TESTICLES
TECHNIQUE: Complete ultrasound examination of the testicles, epididymis, and
other scrotal structures was performed. Color and spectral Doppler
ultrasound were also utilized to evaluate blood flow to the
testicles.

[Series 1: us scrotum · 0.08mm/px · 13 of 53 slices shown]
[im 1/53]
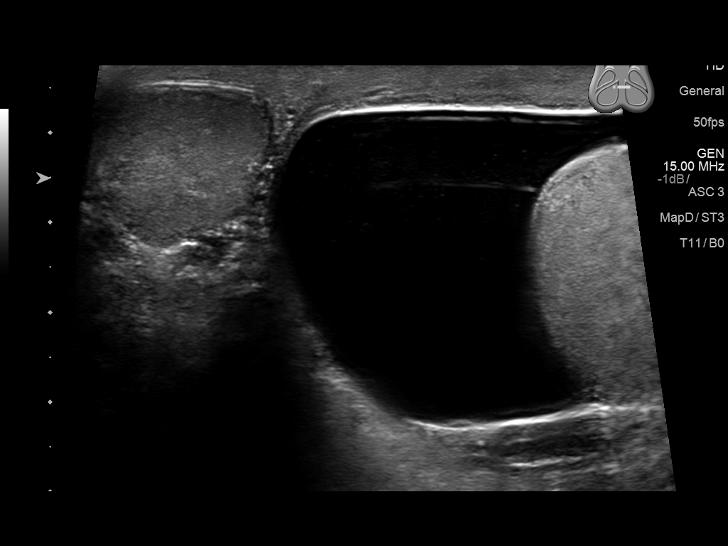
[im 5/53]
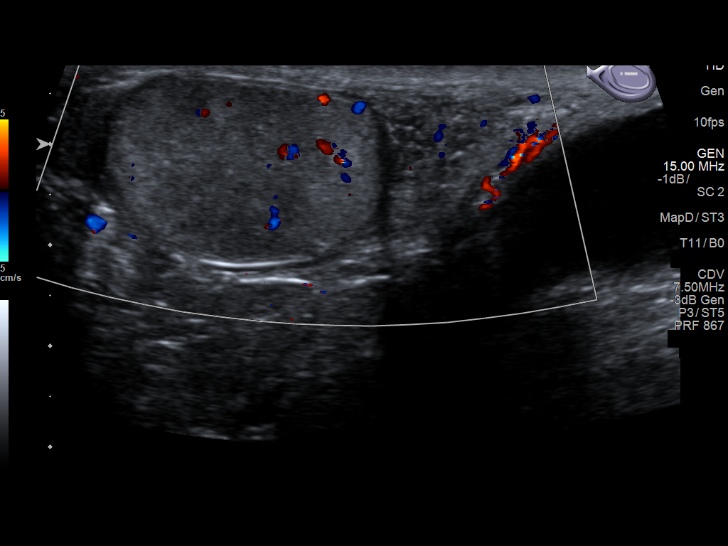
[im 9/53]
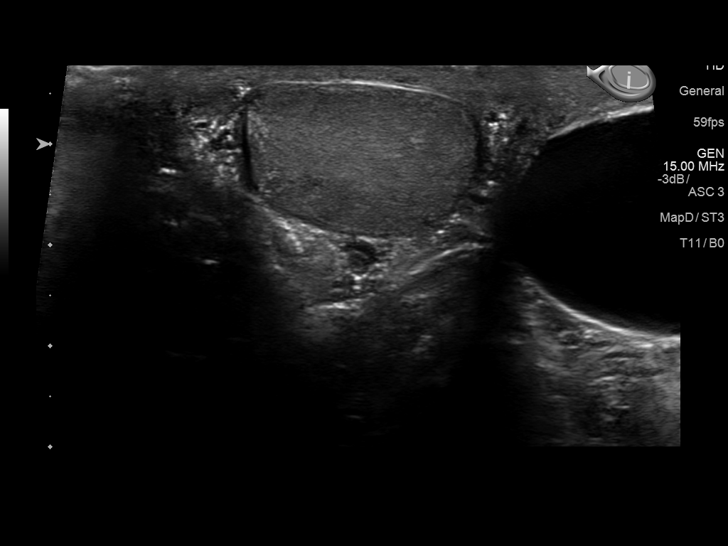
[im 14/53]
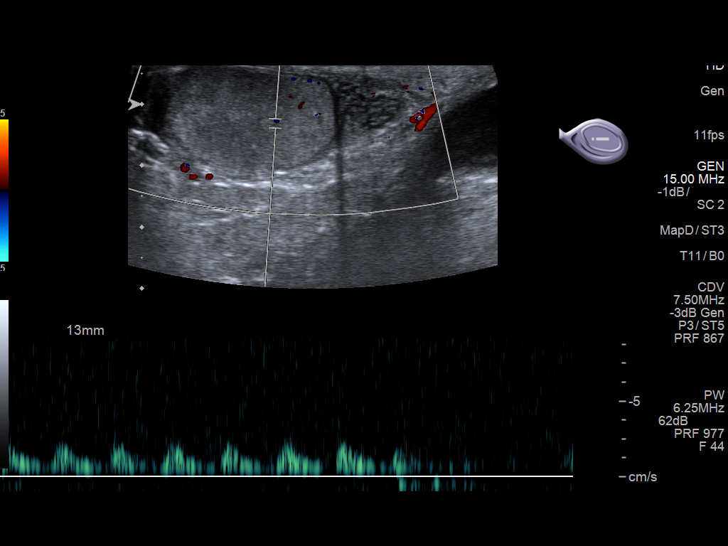
[im 18/53]
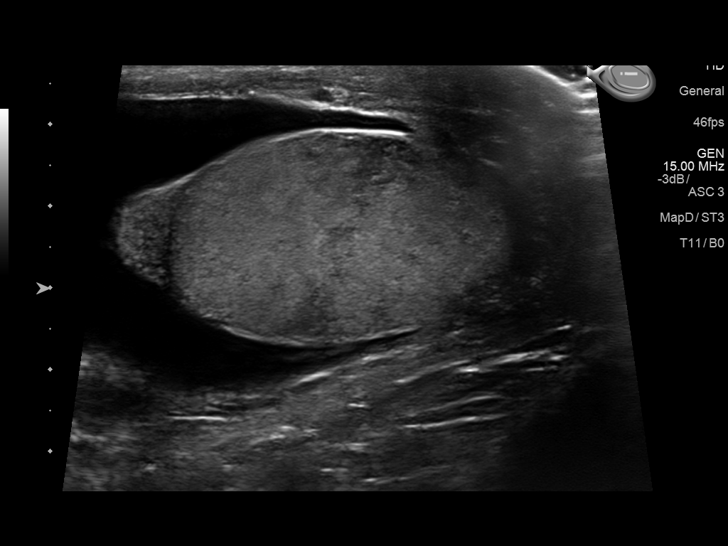
[im 22/53]
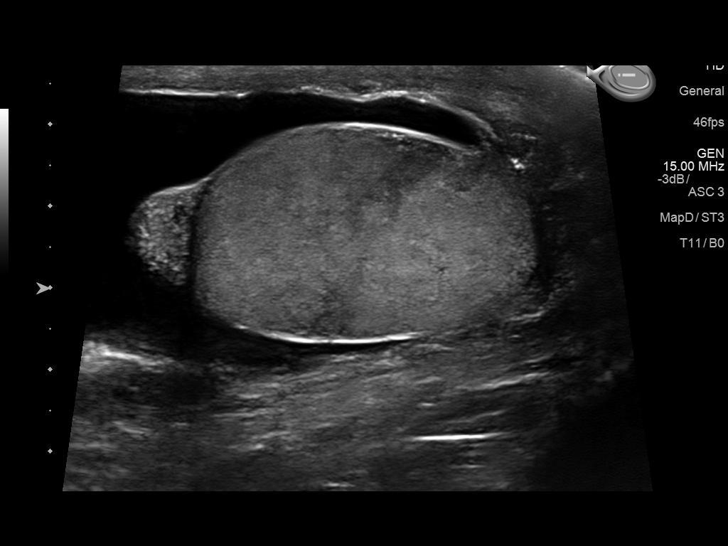
[im 27/53]
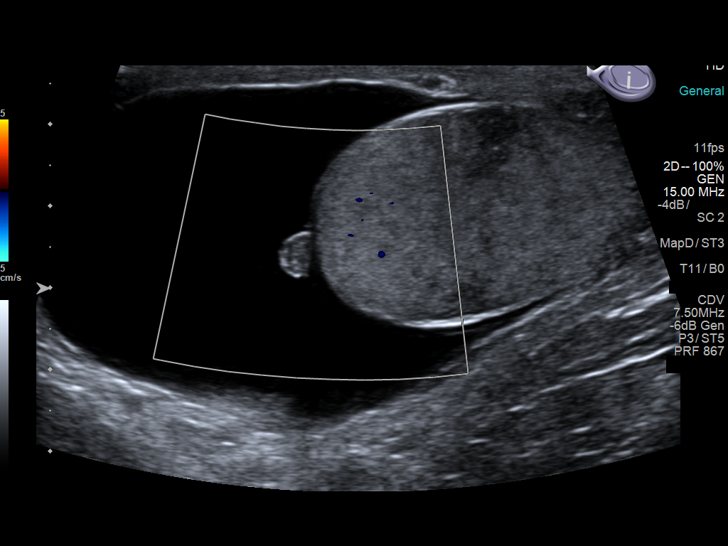
[im 31/53]
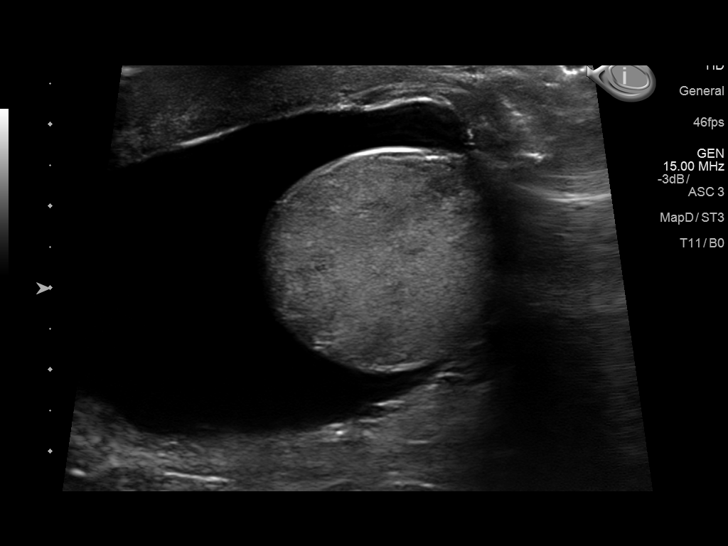
[im 35/53]
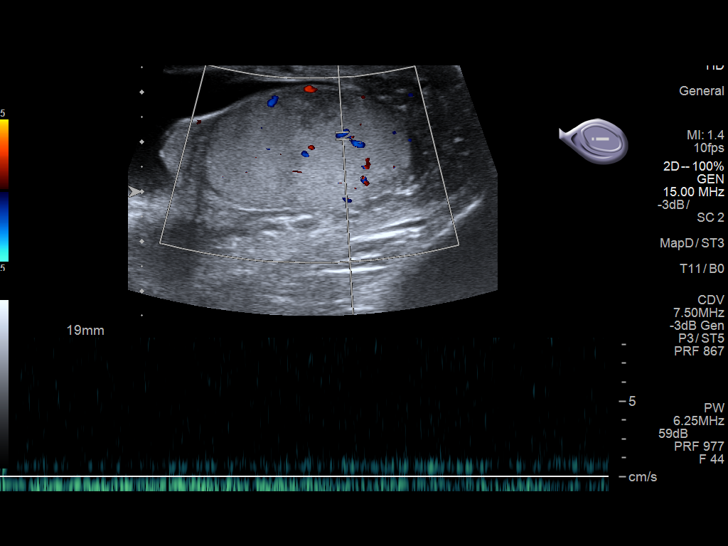
[im 40/53]
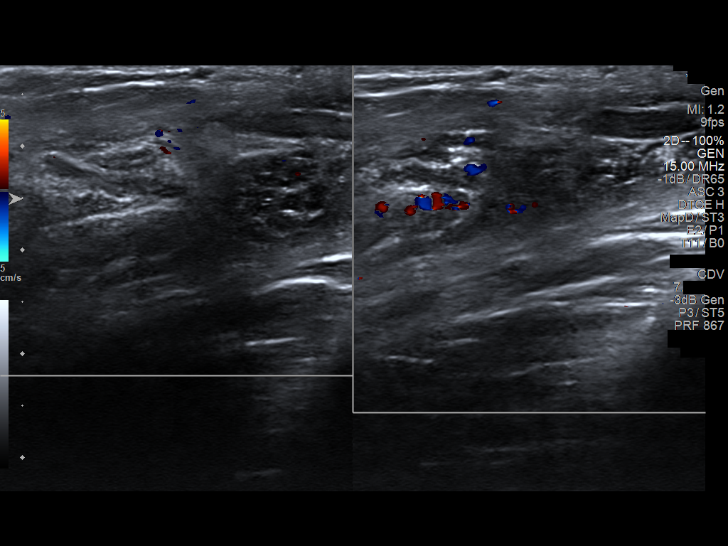
[im 44/53]
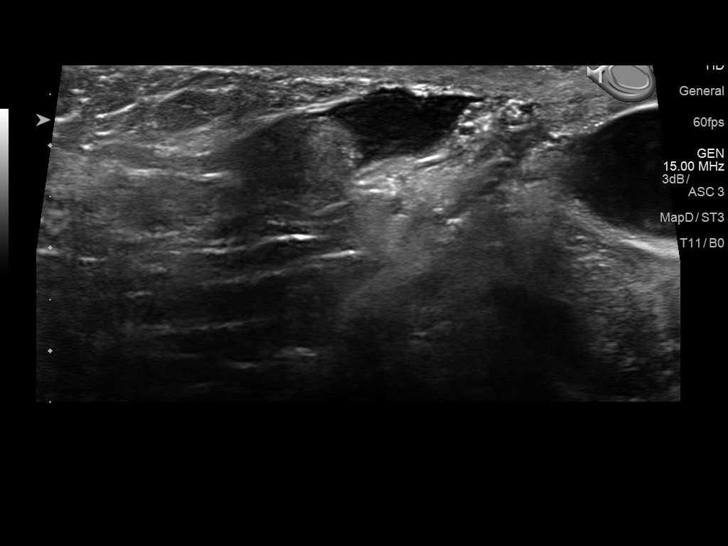
[im 48/53]
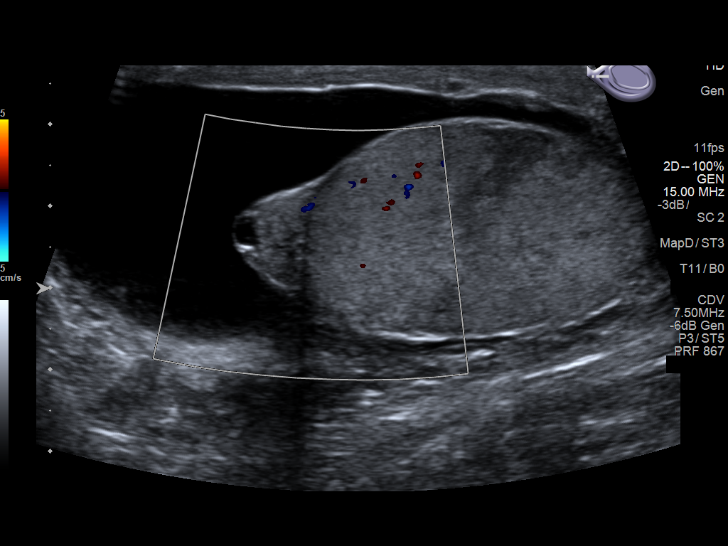
[im 53/53]
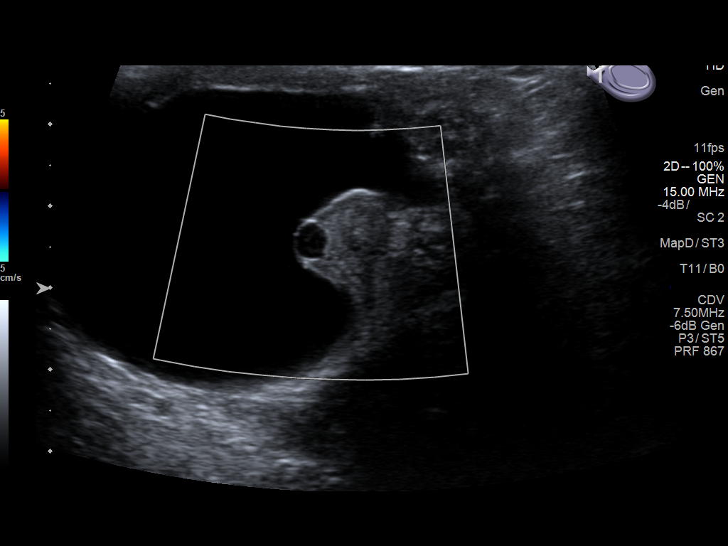

[13 of 25 positions shown; findings below may reference images not displayed]

FINDINGS: Right testicle

Measurements: 2.9 x 1.7 x 2.5 cm.. The testicular echotexture is
somewhat heterogeneous. Vascularity appears normal.

Left testicle

Measurements: 4.2 x 2.6 x 3.2 cm. The left testicular echotexture is
mildly heterogeneous similar to that on the right. Vascularity
appears normal.

Right epididymis:  Normal in size and appearance.

Left epididymis: There is a left epididymal cyst measuring 5 x 4 x 4
mm.

Hydrocele: There is a large left-sided hydrocele. No right-sided
hydrocele is observed.

Varicocele:  There is no varicocele.

Pulsed Doppler interrogation of both testes demonstrates normal low
resistance arterial and venous waveforms bilaterally.
IMPRESSION: Slightly heterogeneous testicular echotexture without vascular
abnormalities or discrete masses. The left testicle is larger than
the right which is a normal finding. Small left epididymal cyst
measuring up to 5 mm in diameter.

Large left-sided hydrocele.

## 2019-01-25 ENCOUNTER — Other Ambulatory Visit: Payer: Self-pay

## 2019-01-25 ENCOUNTER — Encounter: Payer: Self-pay | Admitting: Family Medicine

## 2019-01-25 ENCOUNTER — Ambulatory Visit (INDEPENDENT_AMBULATORY_CARE_PROVIDER_SITE_OTHER): Payer: Federal, State, Local not specified - PPO | Admitting: Family Medicine

## 2019-01-25 DIAGNOSIS — Z8719 Personal history of other diseases of the digestive system: Secondary | ICD-10-CM

## 2019-01-25 DIAGNOSIS — Z905 Acquired absence of kidney: Secondary | ICD-10-CM

## 2019-01-25 DIAGNOSIS — R1032 Left lower quadrant pain: Secondary | ICD-10-CM

## 2019-01-25 MED ORDER — CIPROFLOXACIN HCL 500 MG PO TABS: 500.0000 mg | ORAL_TABLET | Freq: Two times a day (BID) | ORAL | 0 refills | Status: DC

## 2019-01-25 NOTE — Progress Notes (Signed)
TERIN BARSE  MRN: [id number] DOB: 1965/06/08 Virtual Visit via Telephone Note  I connected with [name] on 01/25/19 at  9:40 AM EDT by telephone and verified that I am speaking with the correct person using two identifiers.  Location: Patient: Home Provider: Office   I discussed the limitations, risks, security and privacy concerns of performing an evaluation and management service by telephone and the availability of in person appointments. I also discussed with the patient that there may be a patient responsible charge related to this service. The patient expressed understanding and agreed to proceed.  Subjective:  HPI   The patient is a 53 year old male who presents via phone visit.  He has history of diverticulitis and now has symptoms consistent of that.  The symptoms began this past Saturday night which was 4 days ago.  He does admit to having popcorn about a week prior to that. Patient states that he does not think he has had any fever although the first day of symptoms he may have had some chills and sweats.  Patient Active Problem List   Diagnosis Date Noted  . Allergy to alpha-gal 10/14/2017  . Mixed hyperlipidemia 10/14/2017  . GERD (gastroesophageal reflux disease) 10/30/2016  . Allergic rhinitis, seasonal 07/10/2016  . OSA (obstructive sleep apnea) 12/13/2015  . Encounter for screening colonoscopy 09/21/2014  . Hypertension 09/06/2014    Past Medical History:  Diagnosis Date  . Cancer [facility]) Feb 2014   left kidney  . Diverticulitis 2014  . GERD (gastroesophageal reflux disease)   . Hypertension   . Sleep apnea 2000   CPAP   Past Surgical History:  Procedure Laterality Date  . COLONOSCOPY N/A 11/08/2014   Procedure: COLONOSCOPY;  Surgeon: Robert Bellow, MD;  Location: [facility] ENDOSCOPY;  Service: Endoscopy;  Laterality: N/A;  . KIDNEY SURGERY Left 05/2012   [facility] partial removal    Family History  Problem Relation Age of Onset  . Heart disease Father     Social History   Socioeconomic History  . Marital status: Married    Spouse name: Not on file  . Number of children: Not on file  . Years of education: Not on file  . Highest education level: Not on file  Occupational History  . Not on file  Social Needs  . Financial resource strain: Not on file  . Food insecurity    Worry: Not on file    Inability: Not on file  . Transportation needs    Medical: Not on file    Non-medical: Not on file  Tobacco Use  . Smoking status: Never Smoker  . Smokeless tobacco: Never Used  Substance and Sexual Activity  . Alcohol use: Yes    Alcohol/week: 0.0 standard drinks  . Drug use: No  . Sexual activity: Not on file  Lifestyle  . Physical activity    Days per week: Not on file    Minutes per session: Not on file  . Stress: Not on file  Relationships  . Social Herbalist on phone: Not on file    Gets together: Not on file    Attends religious service: Not on file    Active member of club or organization: Not on file    Attends meetings of clubs or organizations: Not on file    Relationship status: Not on file  . Intimate partner violence    Fear of current or ex partner: Not on file  Emotionally abused: Not on file    Physically abused: Not on file    Forced sexual activity: Not on file  Other Topics Concern  . Not on file  Social History Narrative  . Not on file    Outpatient Encounter Medications as of 01/25/2019  Medication Sig  . amLODipine (NORVASC) 10 MG tablet Take 1 tablet (10 mg total) by mouth daily.  Marland Kitchen aspirin 81 MG EC tablet   . EPINEPHrine 0.1 MG/0.1ML SOAJ Inject 1 Dose as directed once as needed.  . hydrochlorothiazide (MICROZIDE) 12.5 MG capsule Take 12.5 mg by mouth daily.  . metoprolol succinate (TOPROL-XL) 25 MG 24 hr tablet Take 25 mg by mouth daily.  Marland Kitchen omeprazole (PRILOSEC) 20 MG capsule TAKE 1 CAPSULE BY MOUTH DAILY AS NEEDED FOR HEARTBURN OR REFULX  . sucralfate (CARAFATE) 1 g tablet Take 1  tablet (1 g total) by mouth 4 (four) times daily -  with meals and at bedtime. (Patient not taking: Reported on 10/14/2017)   No facility-administered encounter medications on file as of 01/25/2019.    Allergies  Allergen Reactions  . Beef Extract Anaphylaxis  . 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 Pork (Porcine) Protein Anaphylaxis   Review of Systems  Constitutional: Negative for chills, diaphoresis and fever.  Gastrointestinal: Positive for abdominal pain and diarrhea. Negative for blood in stool, constipation, heartburn, nausea and vomiting.    Objective:  There were no vitals taken for this visit.  Physical Exam: No apparent respiratory distress during telephonic interview.  Assessment and Plan :   1. Colicky LLQ abdominal pain Developed LLQ cramping abdominal pain 3-4 days ago. Denies any fever, vomiting or bloody stools. Went on a liquid diet and has felt much better without solid foods. History of diverticulitis in 2013 with similar symptoms. Will treat with continued liquid diet, Cipro 500 mg BID and call report of progress in 4 days. Should go to the ER if pain worsens or fever over 101. May need to add Flagyl if improvement does not continue. - ciprofloxacin (CIPRO) 500 MG tablet; Take 1 tablet (500 mg total) by mouth 2 (two) times daily.  Dispense: 20 tablet; Refill: 0  2. History of colonic diverticulitis Had fever and confirmed left colonic diverticulitis on CT scan on 02-11-12. Was treated with Flagyl and Cipro with complete recovery. Did no require hospitalization or surgery. Recent flare of LLQ abdomina pain feels similar but not having any fever this time. Will treat with Cipro and liquid diet. - ciprofloxacin (CIPRO) 500 MG tablet; Take 1 tablet (500 mg total) by mouth 2 (two) times daily.  Dispense: 20 tablet; Refill: 0  3. History of partial nephrectomy Incidental finding of a left kidney mass on CT scan of abdomen/pelvis for diverticulitis on  02-11-12. 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 Thurmond Butts (urologist at [facility]) on 05-05-12. Last follow up with Dr. Thurmond Butts was 10-25-18 and was released for next follow up in 3 years with [facility] prior to appointment. Follow Up Instructions:  I discussed the assessment and treatment plan with the patient. The patient was provided an opportunity to ask questions and all were answered. The patient agreed with the plan and demonstrated an understanding of the instructions.   The patient was advised to call back or seek an in-person evaluation if the symptoms worsen or if the condition fails to improve as anticipated.  I provided 15 minutes of non-face-to-face time during this  encounter.   Vernie Murders, PA

## 2019-03-16 DIAGNOSIS — H9041 Sensorineural hearing loss, unilateral, right ear, with unrestricted hearing on the contralateral side: Secondary | ICD-10-CM | POA: Diagnosis not present

## 2019-03-22 ENCOUNTER — Other Ambulatory Visit: Payer: Self-pay | Admitting: Family Medicine

## 2019-03-22 DIAGNOSIS — K219 Gastro-esophageal reflux disease without esophagitis: Secondary | ICD-10-CM

## 2019-04-20 ENCOUNTER — Ambulatory Visit: Payer: Federal, State, Local not specified - PPO | Attending: Internal Medicine

## 2019-04-20 DIAGNOSIS — Z20822 Contact with and (suspected) exposure to covid-19: Secondary | ICD-10-CM

## 2019-04-21 LAB — NOVEL CORONAVIRUS, NAA: SARS-CoV-2, NAA: NOT DETECTED

## 2019-04-25 DIAGNOSIS — D225 Melanocytic nevi of trunk: Secondary | ICD-10-CM | POA: Diagnosis not present

## 2019-04-25 DIAGNOSIS — L905 Scar conditions and fibrosis of skin: Secondary | ICD-10-CM | POA: Diagnosis not present

## 2019-04-25 DIAGNOSIS — D2272 Melanocytic nevi of left lower limb, including hip: Secondary | ICD-10-CM | POA: Diagnosis not present

## 2019-04-25 DIAGNOSIS — D2261 Melanocytic nevi of right upper limb, including shoulder: Secondary | ICD-10-CM | POA: Diagnosis not present

## 2019-04-25 DIAGNOSIS — D485 Neoplasm of uncertain behavior of skin: Secondary | ICD-10-CM | POA: Diagnosis not present

## 2019-04-25 DIAGNOSIS — D2262 Melanocytic nevi of left upper limb, including shoulder: Secondary | ICD-10-CM | POA: Diagnosis not present

## 2019-05-02 DIAGNOSIS — G4733 Obstructive sleep apnea (adult) (pediatric): Secondary | ICD-10-CM | POA: Diagnosis not present

## 2019-05-17 DIAGNOSIS — D235 Other benign neoplasm of skin of trunk: Secondary | ICD-10-CM | POA: Diagnosis not present

## 2019-05-17 DIAGNOSIS — D225 Melanocytic nevi of trunk: Secondary | ICD-10-CM | POA: Diagnosis not present

## 2019-05-18 ENCOUNTER — Other Ambulatory Visit: Payer: Self-pay | Admitting: Family Medicine

## 2019-05-18 ENCOUNTER — Other Ambulatory Visit: Payer: Self-pay

## 2019-05-18 ENCOUNTER — Ambulatory Visit: Payer: Self-pay

## 2019-05-18 ENCOUNTER — Ambulatory Visit (INDEPENDENT_AMBULATORY_CARE_PROVIDER_SITE_OTHER): Payer: Federal, State, Local not specified - PPO | Admitting: Physician Assistant

## 2019-05-18 DIAGNOSIS — K5792 Diverticulitis of intestine, part unspecified, without perforation or abscess without bleeding: Secondary | ICD-10-CM | POA: Diagnosis not present

## 2019-05-18 DIAGNOSIS — K219 Gastro-esophageal reflux disease without esophagitis: Secondary | ICD-10-CM

## 2019-05-18 MED ORDER — CIPROFLOXACIN HCL 750 MG PO TABS: 750.0000 mg | ORAL_TABLET | Freq: Two times a day (BID) | ORAL | 0 refills | Status: AC

## 2019-05-18 MED ORDER — METRONIDAZOLE 500 MG PO TABS: 500.0000 mg | ORAL_TABLET | Freq: Four times a day (QID) | ORAL | 0 refills | Status: AC

## 2019-05-18 NOTE — Progress Notes (Signed)
Patient: Samuel Oliver Male    DOB: 1965-11-06   54 y.o.   MRN: [id number] Visit Date: 05/18/2019  Today's Provider: Trinna Post, PA-C   Chief Complaint  Patient presents with  . Abdominal Pain   Subjective:    I, Samuel Oliver,CMA am acting as a Education administrator for [employer].  Virtual Visit via Telephone Note  I connected with Samuel Oliver on 05/18/19 at  3:40 PM EST by telephone and verified that I am speaking with the correct person using two identifiers.  Location: Patient: home Provider: Office   I discussed the limitations, risks, security and privacy concerns of performing an evaluation and management service by telephone and the availability of in person appointments. I also discussed with the patient that there may be a patient responsible charge related to this service. The patient expressed understanding and agreed to proceed.  Patient with history of diveritculitis called stating that he is having abdominal pain that started yesterday. He states that he has diverticulitis and feels that this is a flare.  He was treated 4 months ago with ciprofloxacin. and has been fine until now. He rates his pain at 7. His abdomin is tender to touch. He states the pain is lower to mid abdomin left side. Across where his pants snap. He has loose stool. He states the pain is better for a while after a BM. No other symptoms.  Abdominal Pain This is a recurrent problem. The current episode started yesterday. The problem occurs constantly. The pain is located in the LUQ. The pain is at a severity of 6/10. The pain is moderate. The quality of the pain is sharp and cramping. The abdominal pain does not radiate. Associated symptoms include diarrhea. Pertinent negatives include no constipation, frequency, headaches, nausea or vomiting. The pain is aggravated by eating. The pain is relieved by nothing. He has tried nothing for the symptoms. The treatment provided no relief.       Allergies  Allergen Reactions  . Beef (Bovine) Protein Anaphylaxis  . 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 Pork (Porcine) Protein Anaphylaxis     Current Outpatient Medications:  .  amLODipine (NORVASC) 10 MG tablet, Take 1 tablet (10 mg total) by mouth daily., Disp: 90 tablet, Rfl: 3 .  EPINEPHrine 0.1 MG/0.1ML SOAJ, Inject 1 Dose as directed once as needed., Disp: , Rfl:  .  hydrochlorothiazide (MICROZIDE) 12.5 MG capsule, Take 12.5 mg by mouth daily., Disp: , Rfl:  .  metoprolol succinate (TOPROL-XL) 25 MG 24 hr tablet, Take 25 mg by mouth daily., Disp: , Rfl:  .  omeprazole (PRILOSEC) 20 MG capsule, TAKE 1 CAPSULE BY MOUTH DAILY AS NEEDED FOR HEARTBURN OR REFULX, Disp: 90 capsule, Rfl: 0 .  ciprofloxacin (CIPRO) 500 MG tablet, Take 1 tablet (500 mg total) by mouth 2 (two) times daily. (Patient not taking: Reported on 05/18/2019), Disp: 20 tablet, Rfl: 0  Review of Systems  Gastrointestinal: Positive for abdominal pain and diarrhea. Negative for constipation, nausea and vomiting.  Genitourinary: Negative for frequency.  Neurological: Negative for headaches.    Social History   Tobacco Use  . Smoking status: Never Smoker  . Smokeless tobacco: Never Used  Substance Use Topics  . Alcohol use: Yes    Alcohol/week: 0.0 standard drinks      Objective:   There were no vitals taken for this visit. There were no vitals filed for this visit.There is no height or  weight on file to calculate BMI.   Physical Exam   No results found for any visits on 05/18/19.     Assessment & Plan    1. Diverticulitis  Treat as below. Consider CT scan if not improving. Return precautions advised.   - ciprofloxacin (CIPRO) 750 MG tablet; Take 1 tablet (750 mg total) by mouth 2 (two) times daily for 7 days.  Dispense: 14 tablet; Refill: 0 - metroNIDAZOLE (FLAGYL) 500 MG tablet; Take 1 tablet (500 mg total) by mouth 4 (four) times daily for 7 days.  Dispense: 28 tablet;  Refill: 0  I discussed the assessment and treatment plan with the patient. The patient was provided an opportunity to ask questions and all were answered. The patient agreed with the plan and demonstrated an understanding of the instructions.   The patient was advised to call back or seek an in-person evaluation if the symptoms worsen or if the condition fails to improve as anticipated.  The entirety of the information documented in the History of Present Illness, Review of Systems and Physical Exam were personally obtained by me. Portions of this information were initially documented by [facility] and reviewed by me for thoroughness and accuracy.   F/u PRN    Trinna Post, PA-C  [facility]

## 2019-05-18 NOTE — Telephone Encounter (Signed)
Patient called stating that he is having abdominal pain that started yesterday. He states that he has diverticulitis and feels that this is a flare.  He was treated 4 months ago with antibiotic and has been fine until now. He rates his pain at 7. His abdomin is tender to touch. He states the pain is lower to mid abdomin left side. Across where his pants snap. He has loose stool. He states the pain is better for a while after a BM. No other symptoms. Care advice read to patient. He verbalized understanding of all information. Virtual appointment scheduled today.   Reason for Disposition . [1] MILD-MODERATE pain AND [2] constant AND [3] present > 2 hours  Answer Assessment - Initial Assessment Questions 1. LOCATION: "Where does it hurt?"      Lower abdomine left side umbilical area 2. RADIATION: "Does the pain shoot anywhere else?" (e.g., chest, back)    no 3. ONSET: "When did the pain begin?" (Minutes, hours or days ago)      yesterday 4. SUDDEN: "Gradual or sudden onset?"     gradual 5. PATTERN "Does the pain come and go, or is it constant?"    - If constant: "Is it getting better, staying the same, or worsening?"      (Note: Constant means the pain never goes away completely; most serious pain is constant and it progresses)     - If intermittent: "How long does it last?" "Do you have pain now?"     (Note: Intermittent means the pain goes away completely between bouts)     constant 6. SEVERITY: "How bad is the pain?"  (e.g., Scale 1-10; mild, moderate, or severe)    - MILD (1-3): doesn't interfere with normal activities, abdomen soft and not tender to touch     - MODERATE (4-7): interferes with normal activities or awakens from sleep, tender to touch     - SEVERE (8-10): excruciating pain, doubled over, unable to do any normal activities      7 7. RECURRENT SYMPTOM: "Have you ever had this type of abdominal pain before?" If so, ask: "When was the last time?" and "What happened that time?"     Yes 4 months ago 8. CAUSE: "What do you think is causing the abdominal pain?"    diverticulitis 9. RELIEVING/AGGRAVATING FACTORS: "What makes it better or worse?" (e.g., movement, antacids, bowel movement)    BM 10. OTHER SYMPTOMS: "Has there been any vomiting, diarrhea, constipation, or urine problems?"     Loose stool  Protocols used: ABDOMINAL PAIN - MALE-A-AH

## 2019-05-23 ENCOUNTER — Telehealth: Payer: Self-pay

## 2019-05-23 NOTE — Telephone Encounter (Signed)
Copied from [place] [id number]. Topic: General - Other >> May 23, 2019  9:47 AM Greggory Keen D wrote: Reason for CRM: Pt called saying he thinks he is having an allergic reaction to one of the medications that Fabio Bering gave him 2/17.  He is breaking out in a rash back of his and and groin area  CB# [phone]

## 2019-05-23 NOTE — Telephone Encounter (Signed)
He got two medications from me: cipro and flagyl. He got cipro before last fall without incident. It is possible that either one of those medications is causing the issue, though I might suspect flagyl a little more since he tolerate cipro before. He has two options, since flagyl is the new medication he could potentially discontinue that and continue the cipro. If we would like to pick a different antibiotic altogether which is also a reasonable decision, I can change the course to Augmentin as he may not had a long enough time for the medicine to work to treat his issue. How is he doing with his abdominal pain? Let me know what he wants to do moving forward.

## 2019-05-23 NOTE — Telephone Encounter (Signed)
Patient was advised of your message he states that he will stop flagyl, abdominal pain is improving. KW

## 2019-11-20 ENCOUNTER — Other Ambulatory Visit: Payer: Self-pay | Admitting: Family Medicine

## 2019-11-20 NOTE — Telephone Encounter (Signed)
Pt due for an appt- called pt and pt stated he does not ave insurance and cannot afford the out of pocket- refilling for 30 day courtesy RF.

## 2019-12-16 ENCOUNTER — Telehealth: Payer: Self-pay | Admitting: Physician Assistant

## 2019-12-16 NOTE — Telephone Encounter (Signed)
Requested medication (s) are due for refill today: yes  Requested medication (s) are on the active medication list: yes     Future visit scheduled: yes  Notes to clinic: overdue for follow up    Requested Prescriptions  Pending Prescriptions Disp Refills   amLODipine (NORVASC) 10 MG tablet [Pharmacy Med Name: AMLODIPINE BESYLATE 10 MG TAB] 30 tablet 0    Sig: TAKE 1 TABLET BY MOUTH EVERY DAY      Cardiovascular:  Calcium Channel Blockers Failed - 12/16/2019  2:34 PM      Failed - Last BP in normal range    BP Readings from Last 1 Encounters:  10/12/18 (!) 146/82          Failed - Valid encounter within last 6 months    Recent Outpatient Visits           7 months ago Diverticulitis   [place], Adriana M, PA-C   10 months ago Colicky LLQ abdominal pain   [facility], [place], [place]   1 year ago Left-sided low back pain without sciatica, unspecified chronicity   [facility], [place]uff, [place]   2 years ago Essential hypertension   [facility] [place], [place]   2 years ago Essential hypertension   [facility] [place], [place]

## 2020-01-05 ENCOUNTER — Other Ambulatory Visit: Payer: Self-pay | Admitting: Physician Assistant

## 2020-01-06 ENCOUNTER — Other Ambulatory Visit: Payer: Self-pay | Admitting: Physician Assistant

## 2020-01-06 NOTE — Telephone Encounter (Signed)
OK. We have been filling it. If he would like the cardiologist to fill it, then please request the RX from them. Otherwise would need an appt to monitor this medication.

## 2020-01-06 NOTE — Telephone Encounter (Signed)
Per inititial encounter "Reason for call:  "Patient states he is completely out of his amLODipine (NORVASC) 10 MG tablet and would like request sent in today with a follow up call. Patient unclear why an appointment is needed stating he followed up with his cardiologist 6 months ago.";Pt notified that this prescription is managed per cardiology; he requested that I notify the pharmacy; pharmacy verified; will notify pharmacy.

## 2020-01-06 NOTE — Telephone Encounter (Signed)
Patient does not understand why he needs appt. With Adriana to get Amlodipine since his cardiologist is the one who manages this.

## 2020-01-06 NOTE — Telephone Encounter (Signed)
Requested medication (s) are due for refill today: Yes  Requested medication (s) are on the active medication list: Yes  Last refill:  11/20/19  Future visit scheduled: No  Notes to clinic:  Pt. Reports he does not have insurance right now, reluctant to make appointment. Please advise pt.    Requested Prescriptions  Pending Prescriptions Disp Refills   amLODipine (NORVASC) 10 MG tablet [Pharmacy Med Name: AMLODIPINE BESYLATE 10 MG TAB] 30 tablet 0    Sig: TAKE 1 TABLET BY MOUTH EVERY DAY      Cardiovascular:  Calcium Channel Blockers Failed - 01/06/2020  2:09 PM      Failed - Last BP in normal range    BP Readings from Last 1 Encounters:  10/12/18 (!) 146/82          Failed - Valid encounter within last 6 months    Recent Outpatient Visits           7 months ago Diverticulitis   [facility] Carles Collet M, PA-C   11 months ago Colicky LLQ abdominal pain   [facility], [place], [place]   1 year ago Left-sided low back pain without sciatica, unspecified chronicity   [facility], [place], [place]   2 years ago Essential hypertension   [facility] [place], [place]   2 years ago Essential hypertension   [facility] [place], [place]

## 2020-01-06 NOTE — Telephone Encounter (Addendum)
Spoke with Eddie Dibbles, pharmacist at [facility]; notified that this amlodipine is per cardiology; he verbalized understanding and will change record to reflect this; also spoke with Arbie Cookey at [facility] and she also concurs that cardiology is managing this medication; the pt did receive a 30 day courtesy refill on 12/18/19 .

## 2020-01-06 NOTE — Telephone Encounter (Signed)
  Relation to pt: self  Call back number: [place]: [id number] [place], [address] Phone:  [phone]  Fax:  [phone]       Reason for call:  Patient states he is completely out of his amLODipine (NORVASC) 10 MG tablet and would like request sent in today with a follow up call. Patient unclear why an appointment is needed stating he followed up with his cardiologist 6 months ago.

## 2020-01-09 NOTE — Telephone Encounter (Signed)
Can you correct his PCP. Thx

## 2020-03-06 ENCOUNTER — Other Ambulatory Visit: Payer: Self-pay | Admitting: Family Medicine

## 2020-03-06 DIAGNOSIS — K219 Gastro-esophageal reflux disease without esophagitis: Secondary | ICD-10-CM

## 2020-03-06 NOTE — Telephone Encounter (Signed)
Requested medications are due for refill today yes  Requested medications are on the active medication list yes  Last refill 8/30  Last visit 2/17  Future visit scheduled no, canceled 9/22 appt  Notes to clinic Do not see this med/dx addressed in [place].

## 2020-06-26 ENCOUNTER — Other Ambulatory Visit: Payer: Self-pay | Admitting: Physician Assistant

## 2020-06-26 DIAGNOSIS — K219 Gastro-esophageal reflux disease without esophagitis: Secondary | ICD-10-CM

## 2020-10-05 ENCOUNTER — Other Ambulatory Visit (HOSPITAL_COMMUNITY): Payer: Self-pay

## 2020-11-19 ENCOUNTER — Other Ambulatory Visit: Payer: Self-pay

## 2020-11-19 ENCOUNTER — Telehealth: Payer: Self-pay | Admitting: Physician Assistant

## 2020-11-19 DIAGNOSIS — K219 Gastro-esophageal reflux disease without esophagitis: Secondary | ICD-10-CM

## 2020-11-19 NOTE — Telephone Encounter (Signed)
Converted to refill req

## 2020-11-19 NOTE — Telephone Encounter (Signed)
[facility] faxed refill request for the following medications:   omeprazole (PRILOSEC) 20 MG capsule  Please advise.  

## 2020-12-20 ENCOUNTER — Encounter: Payer: Self-pay | Admitting: Physician Assistant

## 2020-12-20 ENCOUNTER — Telehealth: Payer: Self-pay

## 2020-12-20 NOTE — Telephone Encounter (Signed)
Copied from [place] [phone]. Topic: General - Other >> Dec 20, 2020 10:02 AM Yvette Rack wrote: Reason for CRM: Pt stated he went through a divorce and he no longer has insurance to come in for an appt. Pt stated he has diverticulitis and is in need of the Rx for the antibiotic that was prescribed. Pt would like to ask if Rx could be sent to [id number] - [place], [place] - [address] Pt requests call back to advise. Cb# [phone]

## 2020-12-20 NOTE — Telephone Encounter (Signed)
FYI...   Pt reports having worsening diverticulitis symptoms.  We do not have any available today.  I advised him he would need to be evaluated at an Urgent Care or ER.  He refused and stated he had a $10,000 deductible.  I advised him I know [facility] does offer charity care.  He again refused and stated "I got to do what I got to do"  then he disconnected the call.   Thanks,   -Mickel Baas

## 2022-04-25 DIAGNOSIS — E782 Mixed hyperlipidemia: Secondary | ICD-10-CM | POA: Diagnosis not present

## 2022-04-25 DIAGNOSIS — K219 Gastro-esophageal reflux disease without esophagitis: Secondary | ICD-10-CM | POA: Diagnosis not present

## 2022-04-25 DIAGNOSIS — G4733 Obstructive sleep apnea (adult) (pediatric): Secondary | ICD-10-CM | POA: Diagnosis not present

## 2022-04-25 DIAGNOSIS — I10 Essential (primary) hypertension: Secondary | ICD-10-CM | POA: Diagnosis not present

## 2022-04-25 DIAGNOSIS — E6609 Other obesity due to excess calories: Secondary | ICD-10-CM | POA: Diagnosis not present

## 2022-04-25 DIAGNOSIS — Z6831 Body mass index (BMI) 31.0-31.9, adult: Secondary | ICD-10-CM | POA: Diagnosis not present

## 2022-04-25 DIAGNOSIS — Z950 Presence of cardiac pacemaker: Secondary | ICD-10-CM | POA: Diagnosis not present

## 2023-04-24 DIAGNOSIS — Z950 Presence of cardiac pacemaker: Secondary | ICD-10-CM | POA: Diagnosis not present

## 2023-04-24 DIAGNOSIS — E782 Mixed hyperlipidemia: Secondary | ICD-10-CM | POA: Diagnosis not present

## 2023-04-24 DIAGNOSIS — K219 Gastro-esophageal reflux disease without esophagitis: Secondary | ICD-10-CM | POA: Diagnosis not present

## 2023-04-24 DIAGNOSIS — G4733 Obstructive sleep apnea (adult) (pediatric): Secondary | ICD-10-CM | POA: Diagnosis not present

## 2023-04-24 DIAGNOSIS — E6609 Other obesity due to excess calories: Secondary | ICD-10-CM | POA: Diagnosis not present

## 2023-04-24 DIAGNOSIS — Z6831 Body mass index (BMI) 31.0-31.9, adult: Secondary | ICD-10-CM | POA: Diagnosis not present

## 2023-04-24 DIAGNOSIS — E66811 Obesity, class 1: Secondary | ICD-10-CM | POA: Diagnosis not present

## 2023-04-24 DIAGNOSIS — I10 Essential (primary) hypertension: Secondary | ICD-10-CM | POA: Diagnosis not present

## 2024-03-11 NOTE — Progress Notes (Signed)
 Samuel Oliver                                          MRN: [id number]   03/11/2024   The [facility] Quality Team Specialist reviewed this patient medical record for the purposes of chart review for care gap closure. The following were reviewed: chart review for care gap closure-controlling blood pressure.    [facility] Quality Team
